# Patient Record
Sex: Female | Born: 1950 | Race: White | Hispanic: No | Marital: Married | Smoking: Never smoker
Health system: Southern US, Community
[De-identification: ages and names within clinical notes are randomized; demographics above are authoritative.]

## PROBLEM LIST (undated history)

## (undated) DIAGNOSIS — I639 Cerebral infarction, unspecified: Secondary | ICD-10-CM

## (undated) DIAGNOSIS — I499 Cardiac arrhythmia, unspecified: Secondary | ICD-10-CM

## (undated) HISTORY — DX: Cerebral infarction, unspecified: I63.9

## (undated) HISTORY — PX: ABDOMINAL HYSTERECTOMY: SHX81

## (undated) HISTORY — DX: Cardiac arrhythmia, unspecified: I49.9

---

## 2017-05-27 DIAGNOSIS — J209 Acute bronchitis, unspecified: Secondary | ICD-10-CM | POA: Diagnosis not present

## 2017-08-10 DIAGNOSIS — R69 Illness, unspecified: Secondary | ICD-10-CM | POA: Diagnosis not present

## 2017-08-11 DIAGNOSIS — R69 Illness, unspecified: Secondary | ICD-10-CM | POA: Diagnosis not present

## 2017-08-14 DIAGNOSIS — R69 Illness, unspecified: Secondary | ICD-10-CM | POA: Diagnosis not present

## 2017-11-14 DIAGNOSIS — H43812 Vitreous degeneration, left eye: Secondary | ICD-10-CM | POA: Diagnosis not present

## 2017-12-31 DIAGNOSIS — H43811 Vitreous degeneration, right eye: Secondary | ICD-10-CM | POA: Diagnosis not present

## 2018-02-11 DIAGNOSIS — R69 Illness, unspecified: Secondary | ICD-10-CM | POA: Diagnosis not present

## 2018-04-02 ENCOUNTER — Telehealth: Payer: Self-pay | Admitting: Family Medicine

## 2018-04-02 NOTE — Telephone Encounter (Signed)
Check records and se when last seen I ssee none in the computer record

## 2018-04-02 NOTE — Telephone Encounter (Signed)
Sorry we are overloaded with the patients who have been seeing [place] regularly and we cannot take on any more primary care patients for the foreseeable future

## 2018-04-02 NOTE — Telephone Encounter (Signed)
error 

## 2018-04-02 NOTE — Telephone Encounter (Signed)
Pt would like to reestablish with Dr. Richardson Landry and come in for a check up. She states she was seen 5 or 6 years ago. Her husband is Richard (Ed) Truluck DOB 09/12/45. Her CB# [phone].

## 2018-04-02 NOTE — Telephone Encounter (Addendum)
Patient not seen in Epic and also has no standing paper chart in office -patient stated she was seen here one time a long time ago

## 2018-04-03 ENCOUNTER — Emergency Department (HOSPITAL_COMMUNITY)
Admission: EM | Admit: 2018-04-03 | Discharge: 2018-04-03 | Disposition: A | Discharge status: Home / Self Care | Payer: Medicare HMO | Attending: Emergency Medicine | Admitting: Emergency Medicine

## 2018-04-03 ENCOUNTER — Other Ambulatory Visit: Payer: Self-pay

## 2018-04-03 ENCOUNTER — Emergency Department (HOSPITAL_COMMUNITY): Payer: Medicare HMO

## 2018-04-03 ENCOUNTER — Encounter (HOSPITAL_COMMUNITY): Payer: Self-pay

## 2018-04-03 DIAGNOSIS — I951 Orthostatic hypotension: Secondary | ICD-10-CM

## 2018-04-03 DIAGNOSIS — R1030 Lower abdominal pain, unspecified: Secondary | ICD-10-CM | POA: Diagnosis present

## 2018-04-03 DIAGNOSIS — K5792 Diverticulitis of intestine, part unspecified, without perforation or abscess without bleeding: Secondary | ICD-10-CM | POA: Diagnosis not present

## 2018-04-03 DIAGNOSIS — R109 Unspecified abdominal pain: Secondary | ICD-10-CM

## 2018-04-03 DIAGNOSIS — R55 Syncope and collapse: Secondary | ICD-10-CM

## 2018-04-03 DIAGNOSIS — R1084 Generalized abdominal pain: Secondary | ICD-10-CM | POA: Diagnosis not present

## 2018-04-03 DIAGNOSIS — K5730 Diverticulosis of large intestine without perforation or abscess without bleeding: Secondary | ICD-10-CM | POA: Diagnosis not present

## 2018-04-03 LAB — CBC WITH DIFFERENTIAL/PLATELET
Abs Immature Granulocytes: 0.02 10*3/uL (ref 0.00–0.07)
BASOS PCT: 1 %
Basophils Absolute: 0.1 10*3/uL (ref 0.0–0.1)
EOS ABS: 0.1 10*3/uL (ref 0.0–0.5)
EOS PCT: 1 %
HCT: 41.7 % (ref 36.0–46.0)
HEMOGLOBIN: 13.1 g/dL (ref 12.0–15.0)
Immature Granulocytes: 0 %
Lymphocytes Relative: 12 %
Lymphs Abs: 1.1 10*3/uL (ref 0.7–4.0)
MCH: 27.5 pg (ref 26.0–34.0)
MCHC: 31.4 g/dL (ref 30.0–36.0)
MCV: 87.4 fL (ref 80.0–100.0)
MONO ABS: 0.8 10*3/uL (ref 0.1–1.0)
Monocytes Relative: 8 %
Neutro Abs: 7.5 10*3/uL (ref 1.7–7.7)
Neutrophils Relative %: 78 %
PLATELETS: 266 10*3/uL (ref 150–400)
RBC: 4.77 MIL/uL (ref 3.87–5.11)
RDW: 13.1 % (ref 11.5–15.5)
WBC: 9.6 10*3/uL (ref 4.0–10.5)
nRBC: 0 % (ref 0.0–0.2)

## 2018-04-03 LAB — COMPREHENSIVE METABOLIC PANEL
ALK PHOS: 73 U/L (ref 38–126)
ALT: 25 U/L (ref 0–44)
ANION GAP: 9 (ref 5–15)
AST: 25 U/L (ref 15–41)
Albumin: 3.7 g/dL (ref 3.5–5.0)
BUN: 18 mg/dL (ref 8–23)
CALCIUM: 8.7 mg/dL — AB (ref 8.9–10.3)
CO2: 24 mmol/L (ref 22–32)
Chloride: 104 mmol/L (ref 98–111)
Creatinine, Ser: 0.69 mg/dL (ref 0.44–1.00)
GFR calc non Af Amer: >60 mL/min (ref >60)
Glucose, Bld: 154 mg/dL — ABNORMAL HIGH (ref 70–99)
Potassium: 3.8 mmol/L (ref 3.5–5.1)
Sodium: 137 mmol/L (ref 135–145)
Total Bilirubin: 0.7 mg/dL (ref 0.3–1.2)
Total Protein: 7.1 g/dL (ref 6.5–8.1)

## 2018-04-03 LAB — URINALYSIS, ROUTINE W REFLEX MICROSCOPIC
BILIRUBIN URINE: NEGATIVE
Glucose, UA: NEGATIVE mg/dL
Hgb urine dipstick: NEGATIVE
Ketones, ur: NEGATIVE mg/dL
Leukocytes, UA: NEGATIVE
NITRITE: NEGATIVE
Protein, ur: NEGATIVE mg/dL
SPECIFIC GRAVITY, URINE: 1.017 (ref 1.005–1.030)
pH: 7 (ref 5.0–8.0)

## 2018-04-03 LAB — LIPASE, BLOOD: Lipase: 25 U/L (ref 11–51)

## 2018-04-03 MED ORDER — SODIUM CHLORIDE 0.9 % IV BOLUS (SEPSIS)
1000.0000 mL | Freq: Once | INTRAVENOUS | Status: AC
  Administered 2018-04-03: 1000 mL via INTRAVENOUS

## 2018-04-03 MED ORDER — CIPROFLOXACIN HCL 500 MG PO TABS: 500.0000 mg | ORAL_TABLET | Freq: Two times a day (BID) | ORAL | 0 refills | Status: DC

## 2018-04-03 MED ORDER — METRONIDAZOLE 500 MG PO TABS: 500.0000 mg | ORAL_TABLET | Freq: Three times a day (TID) | ORAL | 0 refills | Status: DC

## 2018-04-03 MED ORDER — METRONIDAZOLE 500 MG PO TABS
500.0000 mg | ORAL_TABLET | Freq: Once | ORAL | Status: AC
  Administered 2018-04-03: 500 mg via ORAL
  Filled 2018-04-03: qty 1

## 2018-04-03 MED ORDER — CIPROFLOXACIN HCL 250 MG PO TABS
500.0000 mg | ORAL_TABLET | Freq: Once | ORAL | Status: AC
  Administered 2018-04-03: 500 mg via ORAL
  Filled 2018-04-03: qty 2

## 2018-04-03 MED ORDER — IOPAMIDOL (ISOVUE-300) INJECTION 61%
100.0000 mL | Freq: Once | INTRAVENOUS | Status: AC | PRN
  Administered 2018-04-03: 100 mL via INTRAVENOUS

## 2018-04-03 NOTE — ED Notes (Signed)
Patient transported to CT 

## 2018-04-03 NOTE — Discharge Instructions (Addendum)
Take the prescriptions as directed.  Take over the counter tylenol, as directed on packaging, as needed for discomfort. Eat a liquid diet for the next few days, then advance to a bland diet and then to your regular diet slowly as you can tolerate it. Keep yourself hydrated. Move slowly when changing positions. Call your regular medical doctor today to schedule a follow up appointment within the next 3 days. Call the GI doctor today to schedule a follow up appointment within the next week.  Return to the Emergency Department immediately if not improving (or even worsening) despite taking the medicines as prescribed, any black or bloody stool or vomit, if you develop a fever over "101," or for any other concerns.

## 2018-04-03 NOTE — ED Triage Notes (Signed)
Pt c/o abd pain intermittent for the past several days, got up to get a shower so she could go to urgent care when she "passed out in the bathroom"   Pt denies injury with fall and landed sitting on her buttocks.  Pt states it burns when she urinates and the pain is low in her pelvic area.

## 2018-04-03 NOTE — ED Provider Notes (Signed)
[facility] EMERGENCY DEPARTMENT Provider Note   CSN: [id number] Arrival date & time: 04/03/18  0543     History   Chief Complaint Chief Complaint  Patient presents with  . Abdominal Pain    HPI Paige Cole is a 67 y.o. female.  The history is provided by the patient.  Abdominal Pain   This is a new problem. The current episode started more than 2 days ago. The problem occurs daily. The problem has been gradually worsening. The pain is located in the generalized abdominal region. The pain is moderate. Associated symptoms include constipation and frequency. Pertinent negatives include fever, diarrhea, hematochezia, melena, nausea, vomiting, dysuria and headaches. Associated symptoms comments: Urinary pressure . The symptoms are aggravated by certain positions. Nothing relieves the symptoms.  She reports over the past several days she has been having abdominal pain.  She reports now she is having lower abdominal pressure.  This morning she stood up and went to the shower and had a brief syncopal episode.  Denies any trauma.  She reports she has had this before upon standing.  Husband found her, there were no seizures.  She was back to baseline very quickly. No cp/sob    PMH-none Past Surgical History:  Procedure Laterality Date  . ABDOMINAL HYSTERECTOMY       OB History   None      Home Medications    Prior to Admission medications   Not on File    Family History No family history on file.  Social History Social History   Tobacco Use  . Smoking status: Never Smoker  . Smokeless tobacco: Never Used  Substance Use Topics  . Alcohol use: Never    Frequency: Never  . Drug use: Never     Allergies   Penicillins   Review of Systems Review of Systems  Constitutional: Negative for fever.  Respiratory: Negative for shortness of breath.   Cardiovascular: Negative for chest pain.  Gastrointestinal: Positive for abdominal pain and constipation. Negative for  blood in stool, diarrhea, hematochezia, melena, nausea and vomiting.  Genitourinary: Positive for frequency and urgency. Negative for dysuria.  Musculoskeletal: Negative for back pain and neck pain.  Neurological: Positive for syncope. Negative for seizures and headaches.  All other systems reviewed and are negative.    Physical Exam Updated Vital Signs BP 114/64 (BP Location: Left Arm) Comment: Simultaneous filing. User may not have seen previous data.  Pulse 83 Comment: Simultaneous filing. User may not have seen previous data.  Temp 98.5 F (36.9 C) (Oral)   Resp 12 Comment: Simultaneous filing. User may not have seen previous data.  Ht 1.575 m (5\' 2" )   Wt 77.1 kg   SpO2 95% Comment: Simultaneous filing. User may not have seen previous data.  BMI 31.09 kg/m   Physical Exam CONSTITUTIONAL: Well developed/well nourished HEAD: Normocephalic/atraumatic EYES: EOMI/PERRL ENMT: Mucous membranes moist no signs of trauma NECK: supple no meningeal signs SPINE/BACK:entire spine nontender, no bruising/crepitance/stepoffs noted to spine CV: S1/S2 noted, no murmurs/rubs/gallops noted LUNGS: Lungs are clear to auscultation bilaterally, no apparent distress ABDOMEN: soft, mild suprapubic tenderness, no rebound or guarding, bowel sounds noted throughout abdomen GU:no cva tenderness NEURO: Pt is awake/alert/appropriate, moves all extremitiesx4.  No facial droop.  No arm or leg drift EXTREMITIES: pulses normal/equal, full ROM, no deformities noted SKIN: warm, color normal PSYCH: no abnormalities of mood noted, alert and oriented to situation   ED Treatments / Results  Labs (all labs ordered are listed, but only  abnormal results are displayed) Labs Reviewed  COMPREHENSIVE METABOLIC PANEL - Abnormal; Notable for the following components:      Result Value   Glucose, Bld 154 (*)    Calcium 8.7 (*)    All other components within normal limits  CBC WITH DIFFERENTIAL/PLATELET  LIPASE,  BLOOD  URINALYSIS, ROUTINE W REFLEX MICROSCOPIC    EKG EKG Interpretation  Date/Time:  Friday April 03 2018 05:56:04 EST Ventricular Rate:  77 PR Interval:    QRS Duration: 100 QT Interval:  371 QTC Calculation: 420 R Axis:   3 Text Interpretation:  Sinus rhythm Low voltage, precordial leads No previous ECGs available Confirmed by Ripley Fraise [phone]) on 04/03/2018 6:02:28 AM   Radiology No results found.  Procedures Procedures    Medications Ordered in ED Medications  sodium chloride 0.9 % bolus 1,000 mL (has no administration in time range)  sodium chloride 0.9 % bolus 1,000 mL (1,000 mLs Intravenous New Bag/Given 04/03/18 0923)     Initial Impression / Assessment and Plan / ED Course  I have reviewed the triage vital signs and the nursing notes.  Pertinent labs & imaging results that were available during my care of the patient were reviewed by me and considered in my medical decision making (see chart for details).     7:05 AM His main complaint is abdominal pain.  Labs and urinalysis pending.  Strong suspicion her syncopal event was a vagal response.  EKG is unremarkable.  She denies any chest pain or shortness of breath. 7:22 AM She reports continued abdominal pain.  She declines pain meds.  On repeat exam, she has focal left lower quadrant and suprapubic tenderness.  We will proceed with CT imaging Signed out to Dr. Thurnell Garbe with imaging/urinalysis pending  Final Clinical Impressions(s) / ED Diagnoses   Final diagnoses:  Syncope and collapse    ED Discharge Orders    None       Ripley Fraise, MD 04/03/18 [phone]

## 2018-04-03 NOTE — ED Provider Notes (Signed)
Pt received at sign out with CT pending. 67yo F, c/o abd pain x2 days, "got up too fast" this morning and had syncopal episode while in the shower; immediately back to baseline and has hx of same. See previous EDP note for further details of HPI and MDM.  Pt initially orthostatic on VS; received IVF and VS improved. Pt ambulatory with steady gait, resps easy, NAD. States she "feels much better now." Pt has tol PO well while in the ED without N/V.  No stooling while in the ED. CT with diverticulitis, WBC count normal and pt remains afebrile. Offered admission; pt would like to go home. Pt makes her own medical decisions and family is in agreement with her decision. Return precautions given. Dx and testing d/w pt and family.  Questions answered.  Verb understanding, agreeable to d/c home with outpt f/u.   Patient Vitals for the past 24 hrs:  BP Temp Temp src Pulse Resp SpO2 Height Weight  04/03/18 0830 132/81 - - 79 12 97 % - -  04/03/18 0600 114/64 98.5 F (36.9 C) Oral 83 12 95 % - -  04/03/18 0557 - - - - - - 5\' 2"  (1.575 m) 77.1 kg    09:06:16 Orthostatic Vital Signs RG  Orthostatic Lying   BP- Lying: 138/74   Pulse- Lying: 81       Orthostatic Sitting  BP- Sitting: 141/76   Pulse- Sitting: 81       Orthostatic Standing at 0 minutes  BP- Standing at 0 minutes: 135/78   Pulse- Standing at 0 minutes: 87     Results for orders placed or performed during the hospital encounter of 04/03/18  CBC with Differential/Platelet  Result Value Ref Range   WBC 9.6 4.0 - 10.5 K/uL   RBC 4.77 3.87 - 5.11 MIL/uL   Hemoglobin 13.1 12.0 - 15.0 g/dL   HCT 41.7 36.0 - 46.0 %   MCV 87.4 80.0 - 100.0 fL   MCH 27.5 26.0 - 34.0 pg   MCHC 31.4 30.0 - 36.0 g/dL   RDW 13.1 11.5 - 15.5 %   Platelets 266 150 - 400 K/uL   nRBC 0.0 0.0 - 0.2 %   Neutrophils Relative % 78 %   Neutro Abs 7.5 1.7 - 7.7 K/uL   Lymphocytes Relative 12 %   Lymphs Abs 1.1 0.7 - 4.0 K/uL   Monocytes Relative 8 %   Monocytes  Absolute 0.8 0.1 - 1.0 K/uL   Eosinophils Relative 1 %   Eosinophils Absolute 0.1 0.0 - 0.5 K/uL   Basophils Relative 1 %   Basophils Absolute 0.1 0.0 - 0.1 K/uL   Immature Granulocytes 0 %   Abs Immature Granulocytes 0.02 0.00 - 0.07 K/uL  Comprehensive metabolic panel  Result Value Ref Range   Sodium 137 135 - 145 mmol/L   Potassium 3.8 3.5 - 5.1 mmol/L   Chloride 104 98 - 111 mmol/L   CO2 24 22 - 32 mmol/L   Glucose, Bld 154 (H) 70 - 99 mg/dL   BUN 18 8 - 23 mg/dL   Creatinine, Ser 0.69 0.44 - 1.00 mg/dL   Calcium 8.7 (L) 8.9 - 10.3 mg/dL   Total Protein 7.1 6.5 - 8.1 g/dL   Albumin 3.7 3.5 - 5.0 g/dL   AST 25 15 - 41 U/L   ALT 25 0 - 44 U/L   Alkaline Phosphatase 73 38 - 126 U/L   Total Bilirubin 0.7 0.3 - 1.2 mg/dL  GFR calc non Af Amer >60 >60 mL/min   GFR calc Af Amer >60 >60 mL/min   Anion gap 9 5 - 15  Lipase, blood  Result Value Ref Range   Lipase 25 11 - 51 U/L  Urinalysis, Routine w reflex microscopic  Result Value Ref Range   Color, Urine STRAW (A) YELLOW   APPearance CLEAR CLEAR   Specific Gravity, Urine 1.017 1.005 - 1.030   pH 7.0 5.0 - 8.0   Glucose, UA NEGATIVE NEGATIVE mg/dL   Hgb urine dipstick NEGATIVE NEGATIVE   Bilirubin Urine NEGATIVE NEGATIVE   Ketones, ur NEGATIVE NEGATIVE mg/dL   Protein, ur NEGATIVE NEGATIVE mg/dL   Nitrite NEGATIVE NEGATIVE   Leukocytes, UA NEGATIVE NEGATIVE   Ct Abdomen Pelvis W Contrast Result Date: 04/03/2018 CLINICAL DATA:  Lower abdominal pain EXAM: CT ABDOMEN AND PELVIS WITH CONTRAST TECHNIQUE: Multidetector CT imaging of the abdomen and pelvis was performed using the standard protocol following bolus administration of intravenous contrast. CONTRAST:  141mL ISOVUE-300 IOPAMIDOL (ISOVUE-300) INJECTION 61% COMPARISON:  None. FINDINGS: Lower chest: Lung bases clear.  Normal heart. Hepatobiliary: 4.3 x 2.2 cm mass in the posterior right liver with peripheral enhancement that fills in on delayed imaging compatible with  hemangioma. Similar 14 mm lesion in the lateral segment of the left lobe of the liver just above the gallbladder fossa also consistent with hemangioma. Gallbladder bile ducts normal. Pancreas: Negative Spleen: Negative Adrenals/Urinary Tract: Adrenal glands are unremarkable. Kidneys are normal, without renal calculi, focal lesion, or hydronephrosis. Bladder is unremarkable. Stomach/Bowel: Normal stomach. Negative for bowel obstruction. Normal appendix Segmental edema in the sigmoid colon with stranding in the pericolonic fat. Associated diverticular changes in the sigmoid colon. Findings compatible with diverticulitis without abscess or perforation. Vascular/Lymphatic: Mild atherosclerotic disease. Negative for lymphadenopathy. Reproductive: Hysterectomy.  No pelvic mass lesion. Other: No free fluid. Musculoskeletal: No acute skeletal abnormality. Mild degenerative changes in the lumbar spine. IMPRESSION: Sigmoid diverticulitis.  Negative for abscess or perforation. 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 Normal appendix Electronically Signed   By: Franchot Gallo M.D.   On: 04/03/2018 08:23       Francine Graven, DO 04/03/18 [phone]

## 2018-04-03 NOTE — ED Notes (Signed)
Patient given discharge instruction, verbalized understand. IV removed, band aid applied. Patient ambulatory out of the department.  

## 2018-04-03 NOTE — ED Notes (Signed)
Pt returned from CT and assisted to BR

## 2018-04-06 NOTE — Telephone Encounter (Signed)
Contacted Ms. Waszak and made her aware.

## 2018-04-07 NOTE — ED Notes (Signed)
Pt called reporting the antibiotics are making her nauseated and she can't follow up with gi or pcp.  Notified Dr. Roderic Palau and called in prescription for zofran 4mg  ODT every 4 hours prn nausea dispense 15.  Prescription called in to [facility] [address].

## 2018-04-28 ENCOUNTER — Telehealth (INDEPENDENT_AMBULATORY_CARE_PROVIDER_SITE_OTHER): Payer: Self-pay | Admitting: *Deleted

## 2018-04-28 ENCOUNTER — Encounter (INDEPENDENT_AMBULATORY_CARE_PROVIDER_SITE_OTHER): Payer: Self-pay | Admitting: Internal Medicine

## 2018-04-28 ENCOUNTER — Ambulatory Visit (INDEPENDENT_AMBULATORY_CARE_PROVIDER_SITE_OTHER): Payer: Medicare HMO | Admitting: Internal Medicine

## 2018-04-28 ENCOUNTER — Encounter (INDEPENDENT_AMBULATORY_CARE_PROVIDER_SITE_OTHER): Payer: Self-pay | Admitting: *Deleted

## 2018-04-28 VITALS — BP 162/80 | HR 64 | Temp 98.2°F | Ht 62.0 in | Wt 168.4 lb

## 2018-04-28 DIAGNOSIS — K5732 Diverticulitis of large intestine without perforation or abscess without bleeding: Secondary | ICD-10-CM | POA: Diagnosis not present

## 2018-04-28 MED ORDER — CIPROFLOXACIN HCL 500 MG PO TABS: 500.0000 mg | ORAL_TABLET | Freq: Two times a day (BID) | ORAL | 0 refills | Status: DC

## 2018-04-28 MED ORDER — METRONIDAZOLE 250 MG PO TABS: 250.0000 mg | ORAL_TABLET | Freq: Three times a day (TID) | ORAL | 0 refills | Status: DC

## 2018-04-28 MED ORDER — PEG 3350-KCL-NA BICARB-NACL 420 G PO SOLR: 4000.0000 mL | Freq: Once | ORAL | 0 refills | Status: AC

## 2018-04-28 NOTE — Patient Instructions (Signed)
The risks of bleeding, perforation and infection were reviewed with patient.  

## 2018-04-28 NOTE — Progress Notes (Signed)
   Subjective:    Patient ID: Paige Cole, female    DOB: August 23, 1950, 67 y.o.   MRN: [id number] HPI  Has appt with [facility] on the 30th of th is month.  Referred by the ED for diverticulitis. Seen in the ED 04/03/2018 for lower abdominal pain x 2 days. Underwent a CT scan of the abdomen/pelvis with CM which revealed sigmoid diverticulitis. Negative for abscess or perforation. 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 Normal appendix.  She was discharge on Cipro and Flagyl x 7 days Cipro 500 mg BID x 7 days and Flagyl 500 mg three times a day x 7 days. She says today she is doing great.  She says she had a pinge in her left lower quadrant 1 week ago which lasted about 24 hrs and then resolved. Her appetite is okay. She says she has been on a liquid and a bland diet. She has been very careful what she eats. She says when she has a BM she does have some tenderness but not always with her BMs.  Back in April of this year, she had the same symptoms for 2-3 days and took Ibuprofen and it resolved. Her appetite is okay.  She has a BM at least one a day.  Sometimes it feels like she is slow to empty.  Has never undergone a colonoscopy.   Review of Systems No past medical history on file.  Past Surgical History:  Procedure Laterality Date  . ABDOMINAL HYSTERECTOMY        Current Outpatient Medications on File Prior to Visit  Medication Sig Dispense Refill  . ciprofloxacin (CIPRO) 500 MG tablet Take 1 tablet (500 mg total) by mouth 2 (two) times daily. (Patient not taking: Reported on 04/28/2018) 14 tablet 0  . metroNIDAZOLE (FLAGYL) 500 MG tablet Take 1 tablet (500 mg total) by mouth 3 (three) times daily. (Patient not taking: Reported on 04/28/2018) 21 tablet 0   No current facility-administered medications on file prior to visit.         Objective:   Physical Exam Blood pressure (!) 162/80, pulse 64, temperature 98.2 F (36.8 C), height 5\' 2"  (1.575 m), weight  168 lb 6.4 oz (76.4 kg). Alert and oriented. Skin warm and dry. Oral mucosa is moist.   . Sclera anicteric, conjunctivae is pink. Thyroid not enlarged. No cervical lymphadenopathy. Lungs clear. Heart regular rate and rhythm.  Abdomen is soft. Bowel sounds are positive. No hepatomegaly. No abdominal masses felt. No tenderness.  No edema to lower extremities.          Assessment & Plan:  Diverticulitis.Need to rule out colonic neoplasm. Colonoscopy. The risks of bleeding, perforation and infection were reviewed with patient.

## 2018-04-28 NOTE — Telephone Encounter (Signed)
Patient needs trilyte 

## 2018-04-29 NOTE — Progress Notes (Signed)
Rx has been filled 

## 2018-05-18 DIAGNOSIS — R030 Elevated blood-pressure reading, without diagnosis of hypertension: Secondary | ICD-10-CM | POA: Diagnosis not present

## 2018-06-08 ENCOUNTER — Other Ambulatory Visit: Payer: Self-pay

## 2018-06-08 ENCOUNTER — Encounter (HOSPITAL_COMMUNITY)
Admission: RE | Disposition: A | Discharge status: Home / Self Care | Payer: Self-pay | Source: Home / Self Care | Attending: Internal Medicine

## 2018-06-08 ENCOUNTER — Encounter (HOSPITAL_COMMUNITY): Payer: Self-pay | Admitting: *Deleted

## 2018-06-08 ENCOUNTER — Ambulatory Visit (HOSPITAL_COMMUNITY)
Admission: RE | Admit: 2018-06-08 | Discharge: 2018-06-08 | Disposition: A | Discharge status: Home / Self Care | Payer: Medicare HMO | Attending: Internal Medicine | Admitting: Internal Medicine

## 2018-06-08 DIAGNOSIS — D123 Benign neoplasm of transverse colon: Secondary | ICD-10-CM | POA: Diagnosis not present

## 2018-06-08 DIAGNOSIS — Z09 Encounter for follow-up examination after completed treatment for conditions other than malignant neoplasm: Secondary | ICD-10-CM | POA: Diagnosis not present

## 2018-06-08 DIAGNOSIS — K5732 Diverticulitis of large intestine without perforation or abscess without bleeding: Secondary | ICD-10-CM | POA: Diagnosis not present

## 2018-06-08 DIAGNOSIS — Z8719 Personal history of other diseases of the digestive system: Secondary | ICD-10-CM | POA: Diagnosis present

## 2018-06-08 DIAGNOSIS — D124 Benign neoplasm of descending colon: Secondary | ICD-10-CM | POA: Insufficient documentation

## 2018-06-08 DIAGNOSIS — K644 Residual hemorrhoidal skin tags: Secondary | ICD-10-CM | POA: Diagnosis not present

## 2018-06-08 DIAGNOSIS — K5730 Diverticulosis of large intestine without perforation or abscess without bleeding: Secondary | ICD-10-CM | POA: Diagnosis not present

## 2018-06-08 DIAGNOSIS — Z880 Allergy status to penicillin: Secondary | ICD-10-CM | POA: Diagnosis not present

## 2018-06-08 DIAGNOSIS — K6289 Other specified diseases of anus and rectum: Secondary | ICD-10-CM

## 2018-06-08 HISTORY — PX: COLONOSCOPY: SHX5424

## 2018-06-08 HISTORY — PX: POLYPECTOMY: SHX5525

## 2018-06-08 SURGERY — COLONOSCOPY
Anesthesia: Moderate Sedation

## 2018-06-08 MED ORDER — MIDAZOLAM HCL 5 MG/5ML IJ SOLN
INTRAMUSCULAR | Status: DC | PRN
  Administered 2018-06-08: 2 mg via INTRAVENOUS
  Administered 2018-06-08: 1 mg via INTRAVENOUS
  Administered 2018-06-08 (×2): 2 mg via INTRAVENOUS
  Administered 2018-06-08: 1 mg via INTRAVENOUS

## 2018-06-08 MED ORDER — MEPERIDINE HCL 50 MG/ML IJ SOLN
INTRAMUSCULAR | Status: DC | PRN
  Administered 2018-06-08 (×2): 25 mg via INTRAVENOUS

## 2018-06-08 MED ORDER — STERILE WATER FOR IRRIGATION IR SOLN
Status: DC | PRN
  Administered 2018-06-08: 08:00:00

## 2018-06-08 MED ORDER — MIDAZOLAM HCL 5 MG/5ML IJ SOLN
INTRAMUSCULAR | Status: AC
  Filled 2018-06-08: qty 10

## 2018-06-08 MED ORDER — MEPERIDINE HCL 50 MG/ML IJ SOLN
INTRAMUSCULAR | Status: AC
  Filled 2018-06-08: qty 1

## 2018-06-08 MED ORDER — SODIUM CHLORIDE 0.9 % IV SOLN
INTRAVENOUS | Status: DC
  Administered 2018-06-08: 1000 mL via INTRAVENOUS

## 2018-06-08 NOTE — H&P (Signed)
Paige Cole is an 68 y.o. female.   Chief Complaint: Patient is here for colonoscopy. HPI: Patient 68 year old Caucasian female who is here for diagnostic colonoscopy.  In mid November 2019 she was diagnosed with sigmoid diverticulitis and treated with antibiotics.  She has not had any pain for about 4 weeks.  She denies any now rectal bleeding.  She is prone to diarrhea but no constipation.  She does not take any antidiarrheals.  She takes Imodium once a month for headache center. Family history is negative for CRC.  Past medical history: History of sigmoid diverticulitis November 2019.  Past Surgical History:  Procedure Laterality Date  .  Vaginal HYSTERECTOMY      History reviewed. No pertinent family history. Social History:  reports that she has never smoked. She has never used smokeless tobacco. She reports that she does not drink alcohol or use drugs.  Allergies:  Allergies  Allergen Reactions  . Penicillins Rash    Has patient had a PCN reaction causing immediate rash, facial/tongue/throat swelling, SOB or lightheadedness with hypotension: No Has patient had a PCN reaction causing severe rash involving mucus membranes or skin necrosis: No Has patient had a PCN reaction that required hospitalization: No Has patient had a PCN reaction occurring within the last 10 years: No If all of the above answers are "NO", then may proceed with Cephalosporin use.     Medications Prior to Admission  Medication Sig Dispense Refill  . ibuprofen (ADVIL,MOTRIN) 200 MG tablet Take 200 mg by mouth every 8 (eight) hours as needed (for pain.).    Marland Kitchen acetaminophen (TYLENOL) 500 MG tablet Take 500 mg by mouth every 6 (six) hours as needed (for pain.).    Marland Kitchen ciprofloxacin (CIPRO) 500 MG tablet Take 1 tablet (500 mg total) by mouth 2 (two) times daily. (Patient not taking: Reported on 06/03/2018) 14 tablet 0  . metroNIDAZOLE (FLAGYL) 250 MG tablet Take 1 tablet (250 mg total) by mouth 3 (three) times  daily. (Patient not taking: Reported on 06/03/2018) 21 tablet 0  . Probiotic Product (PROBIOTIC PO) Take 1 capsule by mouth daily.      No results found for this or any previous visit (from the past 48 hour(s)). No results found.  ROS  Blood pressure (!) 141/70, pulse 80, temperature 97.8 F (36.6 C), temperature source Oral, resp. rate 15, height 5\' 2"  (1.575 m), weight 73.9 kg, SpO2 100 %. Physical Exam  Constitutional: She appears well-developed and well-nourished.  HENT:  Mouth/Throat: Oropharynx is clear and moist.  Eyes: Conjunctivae are normal. No scleral icterus.  Neck: No thyromegaly present.  Cardiovascular: Normal rate, regular rhythm and normal heart sounds.  No murmur heard. Respiratory: Effort normal and breath sounds normal.  GI: She exhibits no distension and no mass.  Musculoskeletal:        General: No edema.  Lymphadenopathy:    She has no cervical adenopathy.  Neurological: She is alert.  Skin: Skin is warm and dry.     Assessment/Plan History of sigmoid diverticulitis. Diagnostic colonoscopy.  Hildred Laser, MD 06/08/2018, 7:31 AM

## 2018-06-08 NOTE — Op Note (Signed)
[facility] Patient Name: Paige Cole Procedure Date: 06/08/2018 7:22 AM MRN: [id number] Date of Birth: 11-17-1950 Attending MD: Hildred Laser , MD CSN: [id number] Age: 68 Admit Type: Outpatient Procedure:                Colonoscopy Indications:              Follow-up of diverticulitis Providers:                Hildred Laser, MD, Hinton Rao, RN, Gerome Sam, RN, Randa Spike, Technician Referring MD:             [facility]. Medicines:                Meperidine 50 mg IV, Midazolam 8 mg IV Complications:            No immediate complications. Estimated Blood Loss:     Estimated blood loss was minimal. Procedure:                Pre-Anesthesia Assessment:                           - Prior to the procedure, a History and Physical                            was performed, and patient medications and                            allergies were reviewed. The patient's tolerance of                            previous anesthesia was also reviewed. The risks                            and benefits of the procedure and the sedation                            options and risks were discussed with the patient.                            All questions were answered, and informed consent                            was obtained. Prior Anticoagulants: The patient                            last took ibuprofen 7 days prior to the procedure.                            ASA Grade Assessment: I - A normal, healthy                            patient. After reviewing the risks and benefits,  the patient was deemed in satisfactory condition to                            undergo the procedure.                           After obtaining informed consent, the colonoscope                            was passed under direct vision. Throughout the                            procedure, the patient's blood pressure, pulse, and               oxygen saturations were monitored continuously. The                            [id number]-[id number] ([id number]) scope was introduced through                            the anus and advanced to the the cecum, identified                            by appendiceal orifice and ileocecal valve. The                            colonoscopy was performed without difficulty. The                            patient tolerated the procedure well. The quality                            of the bowel preparation was excellent. The                            ileocecal valve, appendiceal orifice, and rectum                            were photographed. Scope In: 7:44:36 AM Scope Out: 8:11:31 AM Scope Withdrawal Time: 0 hours 15 minutes 45 seconds  Total Procedure Duration: 0 hours 26 minutes 55 seconds  Findings:      The perianal and digital rectal examinations were normal.      Scattered small and medium mouthed diverticula were found in the sigmoid       colon and hepatic flexure.      Two sessile polyps were found in the descending colon and splenic       flexure. The polyps were 5 mm in size. These polyps were removed with a       cold snare. Resection and retrieval were complete. The pathology       specimen was placed into Bottle Number 1.      External hemorrhoids were found during retroflexion. The hemorrhoids       were small.      Anal papilla(e) were hypertrophied. Impression:               -  Diverticulosis in the sigmoid colon and at the                            hepatic flexure.                           - Two 5 mm polyps in the descending colon and at                            the splenic flexure, removed with a cold snare.                            Resected and retrieved.                           - External hemorrhoids.                           - Anal papilla(e) were hypertrophied. Moderate Sedation:      Moderate (conscious) sedation was administered by the endoscopy nurse        and supervised by the endoscopist. The following parameters were       monitored: oxygen saturation, heart rate, blood pressure, CO2       capnography and response to care. Total physician intraservice time was       34 minutes. Recommendation:           - Patient has a contact number available for                            emergencies. The signs and symptoms of potential                            delayed complications were discussed with the                            patient. Return to normal activities tomorrow.                            Written discharge instructions were provided to the                            patient.                           - High fiber diet today.                           - Continue present medications.                           - No aspirin, ibuprofen, naproxen, or other                            non-steroidal anti-inflammatory drugs for 1 day.                           -  Await pathology results.                           - Repeat colonoscopy is recommended. The                            colonoscopy date will be determined after pathology                            results from today's exam become available for                            review. Procedure Code(s):        --- Professional ---                           [phone], Colonoscopy, flexible; with removal of                            tumor(s), polyp(s), or other lesion(s) by snare                            technique                           99153, Moderate sedation; each additional 15                            minutes intraservice time                           G0500, Moderate sedation services provided by the                            same physician or other qualified health care                            professional performing a gastrointestinal                            endoscopic service that sedation supports,                            requiring the presence of an independent trained                             observer to assist in the monitoring of the                            patient's level of consciousness and physiological                            status; initial 15 minutes of intra-service time;                            patient age 38 years or older (additional  time may                            be reported with [phone], as appropriate) Diagnosis Code(s):        --- Professional ---                           K64.4, Residual hemorrhoidal skin tags                           D12.4, Benign neoplasm of descending colon                           D12.3, Benign neoplasm of transverse colon (hepatic                            flexure or splenic flexure)                           K62.89, Other specified diseases of anus and rectum                           K57.32, Diverticulitis of large intestine without                            perforation or abscess without bleeding                           K57.30, Diverticulosis of large intestine without                            perforation or abscess without bleeding CPT copyright 2018 American [employer] Association. All rights reserved. The codes documented in this report are preliminary and upon coder review may  be revised to meet current compliance requirements. Hildred Laser, MD Hildred Laser, MD 06/08/2018 8:24:48 AM This report has been signed electronically. Number of Addenda: 0

## 2018-06-08 NOTE — Discharge Instructions (Signed)
No aspirin or NSAIDs for 24 hours. Resume other medications as before. High-fiber diet. No driving for 24 hours. Physician will call with biopsy results.  PATIENT INSTRUCTIONS POST-ANESTHESIA  IMMEDIATELY FOLLOWING SURGERY:  Do not drive or operate machinery for the first twenty four hours after surgery.  Do not make any important decisions for twenty four hours after surgery or while taking narcotic pain medications or sedatives.  If you develop intractable nausea and vomiting or a severe headache please notify your doctor immediately.  FOLLOW-UP:  Please make an appointment with your surgeon as instructed. You do not need to follow up with anesthesia unless specifically instructed to do so.  WOUND CARE INSTRUCTIONS (if applicable):  Keep a dry clean dressing on the anesthesia/puncture wound site if there is drainage.  Once the wound has quit draining you may leave it open to air.  Generally you should leave the bandage intact for twenty four hours unless there is drainage.  If the epidural site drains for more than 36-48 hours please call the anesthesia department.  QUESTIONS?:  Please feel free to call your physician or the hospital operator if you have any questions, and they will be happy to assist you.      Colonoscopy, Adult, Care After This sheet gives you information about how to care for yourself after your procedure. Your doctor may also give you more specific instructions. If you have problems or questions, call your doctor. What can I expect after the procedure? After the procedure, it is common to have:  A small amount of blood in your poop for 24 hours.  Some gas.  Mild cramping or bloating in your belly. Follow these instructions at home: General instructions  For the first 24 hours after the procedure: ? Do not drive or use machinery. ? Do not sign important documents. ? Do not drink alcohol. ? Do your daily activities more slowly than normal. ? Eat foods that are  soft and easy to digest.  Take over-the-counter or prescription medicines only as told by your doctor. To help cramping and bloating:   Try walking around.  Put heat on your belly (abdomen) as told by your doctor. Use a heat source that your doctor recommends, such as a moist heat pack or a heating pad. ? Put a towel between your skin and the heat source. ? Leave the heat on for 20-30 minutes. ? Remove the heat if your skin turns bright red. This is especially important if you cannot feel pain, heat, or cold. You can get burned. Eating and drinking   Drink enough fluid to keep your pee (urine) clear or pale yellow.  Return to your normal diet as told by your doctor. Avoid heavy or fried foods that are hard to digest.  Avoid drinking alcohol for as long as told by your doctor. Contact a doctor if:  You have blood in your poop (stool) 2-3 days after the procedure. Get help right away if:  You have more than a small amount of blood in your poop.  You see large clumps of tissue (blood clots) in your poop.  Your belly is swollen.  You feel sick to your stomach (nauseous).  You throw up (vomit).  You have a fever.  You have belly pain that gets worse, and medicine does not help your pain. Summary  After the procedure, it is common to have a small amount of blood in your poop. You may also have mild cramping and bloating in your  belly.  For the first 24 hours after the procedure, do not drive or use machinery, do not sign important documents, and do not drink alcohol.  Get help right away if you have a lot of blood in your poop, feel sick to your stomach, have a fever, or have more belly pain. This information is not intended to replace advice given to you by your health care provider. Make sure you discuss any questions you have with your health care provider. Document Released: 06/08/2010 Document Revised: 03/06/2017 Document Reviewed: 01/29/2016 Elsevier Interactive Patient  Education  2019 [employer] [place].   Colon Polyps  Polyps are tissue growths inside the body. Polyps can grow in many places, including the large intestine (colon). A polyp may be a round bump or a mushroom-shaped growth. You could have one polyp or several. Most colon polyps are noncancerous (benign). However, some colon polyps can become cancerous over time. Finding and removing the polyps early can help prevent this. What are the causes? The exact cause of colon polyps is not known. What increases the risk? You are more likely to develop this condition if you:  Have a family history of colon cancer or colon polyps.  Are older than 81 or older than 45 if you are African American.  Have inflammatory bowel disease, such as ulcerative colitis or Crohn's disease.  Have certain hereditary conditions, such as: ? Familial adenomatous polyposis. ? Lynch syndrome. ? Turcot syndrome. ? Peutz-Jeghers syndrome.  Are overweight.  Smoke cigarettes.  Do not get enough exercise.  Drink too much alcohol.  Eat a diet that is high in fat and red meat and low in fiber.  Had childhood cancer that was treated with abdominal radiation. What are the signs or symptoms? Most polyps do not cause symptoms. If you have symptoms, they may include:  Blood coming from your rectum when having a bowel movement.  Blood in your stool. The stool may look dark red or black.  Abdominal pain.  A change in bowel habits, such as constipation or diarrhea. How is this diagnosed? This condition is diagnosed with a colonoscopy. This is a procedure in which a lighted, flexible scope is inserted into the anus and then passed into the colon to examine the area. Polyps are sometimes found when a colonoscopy is done as part of routine cancer screening tests. How is this treated? Treatment for this condition involves removing any polyps that are found. Most polyps can be removed during a colonoscopy. Those polyps will  then be tested for cancer. Additional treatment may be needed depending on the results of testing. Follow these instructions at home: Lifestyle  Maintain a healthy weight, or lose weight if recommended by your health care provider.  Exercise every day or as told by your health care provider.  Do not use any products that contain nicotine or tobacco, such as cigarettes and e-cigarettes. If you need help quitting, ask your health care provider.  If you drink alcohol, limit how much you have: ? 0-1 drink a day for women. ? 0-2 drinks a day for men.  Be aware of how much alcohol is in your drink. In the [place] one drink equals one 12 oz bottle of beer (355 mL), one 5 oz glass of wine (148 mL), or one 1 oz shot of hard liquor (44 mL). Eating and drinking   Eat foods that are high in fiber, such as fruits, vegetables, and whole grains.  Eat foods that are high in calcium and  vitamin D, such as milk, cheese, yogurt, eggs, liver, fish, and broccoli.  Limit foods that are high in fat, such as fried foods and desserts.  Limit the amount of red meat and processed meat you eat, such as hot dogs, sausage, bacon, and lunch meats. General instructions  Keep all follow-up visits as told by your health care provider. This is important. ? This includes having regularly scheduled colonoscopies. ? Talk to your health care provider about when you need a colonoscopy. Contact a health care provider if:  You have new or worsening bleeding during a bowel movement.  You have new or increased blood in your stool.  You have a change in bowel habits.  You lose weight for no known reason. Summary  Polyps are tissue growths inside the body. Polyps can grow in many places, including the colon.  Most colon polyps are noncancerous (benign), but some can become cancerous over time.  This condition is diagnosed with a colonoscopy.  Treatment for this condition involves removing any polyps that are found.  Most polyps can be removed during a colonoscopy. This information is not intended to replace advice given to you by your health care provider. Make sure you discuss any questions you have with your health care provider. Document Released: 01/31/2004 Document Revised: 08/21/2017 Document Reviewed: 08/21/2017 Elsevier Interactive Patient Education  2019 [employer].    Diverticulosis  Diverticulosis is a condition that develops when small pouches (diverticula) form in the wall of the large intestine (colon). The colon is where water is absorbed and stool is formed. The pouches form when the inside layer of the colon pushes through weak spots in the outer layers of the colon. You may have a few pouches or many of them. What are the causes? The cause of this condition is not known. What increases the risk? The following factors may make you more likely to develop this condition:  Being older than age 22. Your risk for this condition increases with age. Diverticulosis is rare among people younger than age 55. By age 75, many people have it.  Eating a low-fiber diet.  Having frequent constipation.  Being overweight.  Not getting enough exercise.  Smoking.  Taking over-the-counter pain medicines, like aspirin and ibuprofen.  Having a family history of diverticulosis. What are the signs or symptoms? In most people, there are no symptoms of this condition. If you do have symptoms, they may include:  Bloating.  Cramps in the abdomen.  Constipation or diarrhea.  Pain in the lower left side of the abdomen. How is this diagnosed? This condition is most often diagnosed during an exam for other colon problems. Because diverticulosis usually has no symptoms, it often cannot be diagnosed independently. This condition may be diagnosed by:  Using a flexible scope to examine the colon (colonoscopy).  Taking an X-ray of the colon after dye has been put into the colon (barium  enema).  Doing a CT scan. How is this treated? You may not need treatment for this condition if you have never developed an infection related to diverticulosis. If you have had an infection before, treatment may include:  Eating a high-fiber diet. This may include eating more fruits, vegetables, and grains.  Taking a fiber supplement.  Taking a live bacteria supplement (probiotic).  Taking medicine to relax your colon.  Taking antibiotic medicines. Follow these instructions at home:  Drink 6-8 glasses of water or more each day to prevent constipation.  Try not to strain when you  have a bowel movement.  If you have had an infection before: ? Eat more fiber as directed by your health care provider or your diet and nutrition specialist (dietitian). ? Take a fiber supplement or probiotic, if your health care provider approves.  Take over-the-counter and prescription medicines only as told by your health care provider.  If you were prescribed an antibiotic, take it as told by your health care provider. Do not stop taking the antibiotic even if you start to feel better.  Keep all follow-up visits as told by your health care provider. This is important. Contact a health care provider if:  You have pain in your abdomen.  You have bloating.  You have cramps.  You have not had a bowel movement in 3 days. Get help right away if:  Your pain gets worse.  Your bloating becomes very bad.  You have a fever or chills, and your symptoms suddenly get worse.  You vomit.  You have bowel movements that are bloody or black.  You have bleeding from your rectum. Summary  Diverticulosis is a condition that develops when small pouches (diverticula) form in the wall of the large intestine (colon).  You may have a few pouches or many of them.  This condition is most often diagnosed during an exam for other colon problems.  If you have had an infection related to diverticulosis,  treatment may include increasing the fiber in your diet, taking supplements, or taking medicines. This information is not intended to replace advice given to you by your health care provider. Make sure you discuss any questions you have with your health care provider. Document Released: 02/01/2004 Document Revised: 03/25/2016 Document Reviewed: 03/25/2016 Elsevier Interactive Patient Education  2019 [employer]    Hemorrhoids Hemorrhoids are swollen veins that may develop:  In the butt (rectum). These are called internal hemorrhoids.  Around the opening of the butt (anus). These are called external hemorrhoids. Hemorrhoids can cause pain, itching, or bleeding. Most of the time, they do not cause serious problems. They usually get better with diet changes, lifestyle changes, and other home treatments. What are the causes? This condition may be caused by:  Having trouble pooping (constipation).  Pushing hard (straining) to poop.  Watery poop (diarrhea).  Pregnancy.  Being very overweight (obese).  Sitting for long periods of time.  Heavy lifting or other activity that causes you to strain.  Anal sex.  Riding a bike for a long period of time. What are the signs or symptoms? Symptoms of this condition include:  Pain.  Itching or soreness in the butt.  Bleeding from the butt.  Leaking poop.  Swelling in the area.  One or more lumps around the opening of your butt. How is this diagnosed? A doctor can often diagnose this condition by looking at the affected area. The doctor may also:  Do an exam that involves feeling the area with a gloved hand (digital rectal exam).  Examine the area inside your butt using a small tube (anoscope).  Order blood tests. This may be done if you have lost a lot of blood.  Have you get a test that involves looking inside the colon using a flexible tube with a camera on the end (sigmoidoscopy or colonoscopy). How is this treated? This  condition can usually be treated at home. Your doctor may tell you to change what you eat, make lifestyle changes, or try home treatments. If these do not help, procedures can be done to  remove the hemorrhoids or make them smaller. These may involve:  Placing rubber bands at the base of the hemorrhoids to cut off their blood supply.  Injecting medicine into the hemorrhoids to shrink them.  Shining a type of light energy onto the hemorrhoids to cause them to fall off.  Doing surgery to remove the hemorrhoids or cut off their blood supply. Follow these instructions at home: Eating and drinking   Eat foods that have a lot of fiber in them. These include whole grains, beans, nuts, fruits, and vegetables.  Ask your doctor about taking products that have added fiber (fibersupplements).  Reduce the amount of fat in your diet. You can do this by: ? Eating low-fat dairy products. ? Eating less red meat. ? Avoiding processed foods.  Drink enough fluid to keep your pee (urine) pale yellow. Managing pain and swelling   Take a warm-water bath (sitz bath) for 20 minutes to ease pain. Do this 3-4 times a day. You may do this in a bathtub or using a portable sitz bath that fits over the toilet.  If told, put ice on the painful area. It may be helpful to use ice between your warm baths. ? Put ice in a plastic bag. ? Place a towel between your skin and the bag. ? Leave the ice on for 20 minutes, 2-3 times a day. General instructions  Take over-the-counter and prescription medicines only as told by your doctor. ? Medicated creams and medicines may be used as told.  Exercise often. Ask your doctor how much and what kind of exercise is best for you.  Go to the bathroom when you have the urge to poop. Do not wait.  Avoid pushing too hard when you poop.  Keep your butt dry and clean. Use wet toilet paper or moist towelettes after pooping.  Do not sit on the toilet for a long time.  Keep all  follow-up visits as told by your doctor. This is important. Contact a doctor if you:  Have pain and swelling that do not get better with treatment or medicine.  Have trouble pooping.  Cannot poop.  Have pain or swelling outside the area of the hemorrhoids. Get help right away if you have:  Bleeding that will not stop. Summary  Hemorrhoids are swollen veins in the butt or around the opening of the butt.  They can cause pain, itching, or bleeding.  Eat foods that have a lot of fiber in them. These include whole grains, beans, nuts, fruits, and vegetables.  Take a warm-water bath (sitz bath) for 20 minutes to ease pain. Do this 3-4 times a day. This information is not intended to replace advice given to you by your health care provider. Make sure you discuss any questions you have with your health care provider. Document Released: 02/13/2008 Document Revised: 09/25/2017 Document Reviewed: 09/25/2017 Elsevier Interactive Patient Education  2019 [employer].

## 2018-06-10 ENCOUNTER — Encounter (HOSPITAL_COMMUNITY): Payer: Self-pay | Admitting: Internal Medicine

## 2018-06-29 DIAGNOSIS — R030 Elevated blood-pressure reading, without diagnosis of hypertension: Secondary | ICD-10-CM | POA: Diagnosis not present

## 2018-06-29 DIAGNOSIS — R05 Cough: Secondary | ICD-10-CM | POA: Diagnosis not present

## 2018-06-29 DIAGNOSIS — Z23 Encounter for immunization: Secondary | ICD-10-CM | POA: Diagnosis not present

## 2018-07-02 DIAGNOSIS — Z01419 Encounter for gynecological examination (general) (routine) without abnormal findings: Secondary | ICD-10-CM | POA: Diagnosis not present

## 2018-07-02 DIAGNOSIS — Z1212 Encounter for screening for malignant neoplasm of rectum: Secondary | ICD-10-CM | POA: Diagnosis not present

## 2018-07-02 DIAGNOSIS — Z1231 Encounter for screening mammogram for malignant neoplasm of breast: Secondary | ICD-10-CM | POA: Diagnosis not present

## 2018-09-02 ENCOUNTER — Ambulatory Visit (INDEPENDENT_AMBULATORY_CARE_PROVIDER_SITE_OTHER): Payer: Medicare HMO | Admitting: Internal Medicine

## 2018-09-02 ENCOUNTER — Encounter (INDEPENDENT_AMBULATORY_CARE_PROVIDER_SITE_OTHER): Payer: Self-pay | Admitting: Internal Medicine

## 2018-09-02 ENCOUNTER — Other Ambulatory Visit: Payer: Self-pay

## 2018-09-02 DIAGNOSIS — K5732 Diverticulitis of large intestine without perforation or abscess without bleeding: Secondary | ICD-10-CM

## 2018-09-02 MED ORDER — METRONIDAZOLE 500 MG PO TABS: 500.0000 mg | ORAL_TABLET | Freq: Two times a day (BID) | ORAL | 1 refills | Status: DC

## 2018-09-02 MED ORDER — CIPROFLOXACIN HCL 500 MG PO TABS: 500.0000 mg | ORAL_TABLET | Freq: Two times a day (BID) | ORAL | 1 refills | Status: DC

## 2018-09-02 NOTE — Progress Notes (Signed)
   Subjective:    Patient ID: Paige Cole, female    DOB: April 26, 1951, 68 y.o.   MRN: [id number] Start time 2:15pm End 2:30pm. Total time 15 minutes.  HPI This is a telephone OV. Patient is agreeable to talk with me. She is at home. I am in the office. Unable to do video OV. This is a telephone OV due to the COVID-19.  She c/o pain LLQ and lower abdomen today. She thinks she has another occurrence of diverticulitis.  Pain started about 4 days ago. Had some diarrhea this morning. BMs seem a little smaller.   She denies any nausea, vomiting or fever. No urinary symptoms.  She underwent a colonoscopy in January of this year which revealed: diverticulosis in the sigmoid colon and at the hepatic flexure. Two 5 mm polyps in the descending colon and at the splenic flexure. External hemorrhoids.  Anal papilla. Both polyps were tubular adenomas.  04/03/2018 CT abdomen/pelvis with CM: Sigmoid diverticulitis. Seen in the ED for same and was given Cipro and Flagyl .      Review of Systems  History reviewed. No pertinent past medical history.  Past Surgical History:  Procedure Laterality Date  . ABDOMINAL HYSTERECTOMY    . COLONOSCOPY N/A 06/08/2018   Procedure: COLONOSCOPY;  Surgeon: Rogene Houston, MD;  [facility];  Service: Endoscopy;  Laterality: N/A;  7:30  . POLYPECTOMY  06/08/2018   Procedure: POLYPECTOMY;  Surgeon: Rogene Houston, MD;  [facility];  Service: Endoscopy;;  cold snare [place] x 1, DC x1    Allergies  Allergen Reactions  . Penicillins Rash    Has patient had a PCN reaction causing immediate rash, facial/tongue/throat swelling, SOB or lightheadedness with hypotension: No Has patient had a PCN reaction causing severe rash involving mucus membranes or skin necrosis: No Has patient had a PCN reaction that required hospitalization: No Has patient had a PCN reaction occurring within the last 10 years: No If all of the above answers are "NO", then may proceed with  Cephalosporin use.     Current Outpatient Medications on File Prior to Visit  Medication Sig Dispense Refill  . acetaminophen (TYLENOL) 500 MG tablet Take 500 mg by mouth every 6 (six) hours as needed (for pain.).    Marland Kitchen ibuprofen (ADVIL,MOTRIN) 200 MG tablet Take 200 mg by mouth every 8 (eight) hours as needed (for pain.).    Marland Kitchen Probiotic Product (PROBIOTIC PO) Take 1 capsule by mouth daily.     No current facility-administered medications on file prior to visit.         Objective:   Physical Exam Deferred.       Assessment & Plan:  LLQ pain, diverticulitis. Will call in Cipro and Flagyl x 14 days. She will stay on a clear liquid diet for 3-4 days and then progress to a bland diet. She was advised if pain worsens, to go to the ED.

## 2018-10-06 ENCOUNTER — Encounter (INDEPENDENT_AMBULATORY_CARE_PROVIDER_SITE_OTHER): Payer: Self-pay

## 2018-10-07 ENCOUNTER — Ambulatory Visit (INDEPENDENT_AMBULATORY_CARE_PROVIDER_SITE_OTHER): Payer: Medicare HMO | Admitting: Internal Medicine

## 2019-03-04 ENCOUNTER — Ambulatory Visit (INDEPENDENT_AMBULATORY_CARE_PROVIDER_SITE_OTHER): Payer: Medicare HMO | Admitting: Nurse Practitioner

## 2019-03-07 NOTE — Progress Notes (Signed)
Subjective:    Patient ID: Paige Cole, female    DOB: [birth year], 68 y.o.   MRN: [id number]  HPI Paige Cole is a 68 year old female with a past medical history of colon polyps and diverticulitis.  Her first episode of diverticulitis was November 2019 which was confirmed by an abdominal/pelvic CT at [facility].  Diverticulitis pain resolved after taking antibiotics. She underwent a tele visit with Paige Castle NP on 08/2018 with complaints of LLQ pain which was consistent with her past episodes of diverticulitis. She was prescribed Cipro and Flagyl 500mg  po bid x 14 days. She continued  to have a low level of residual LLQ abdominal pain so she was represcribed Cipro and Flagyl 500 mg p.o. twice daily for additional 14 days and her pain resolved.  She denies having any current abdominal pain. If she lift something heavy she may develop left lower quadrant tenderness.  She is passing a normal formed bowel movement once daily.  No rectal bleeding or melena.  She takes ibuprofen 200 mg 1 tab once or twice monthly as needed for knee or foot pain.  Colonoscopy 06/08/2018: - Diverticulosis in the sigmoid colon and at the hepatic flexure. - Two 5 mm tubular adenomatous polyps in the descending colon and at the splenic flexure, removed with a cold snare. Resected and retrieved. - External hemorrhoids. - Anal papilla(e) were hypertrophied. -Recall colonoscopy in 5 years.  Abdominal/pelvic CT 04/03/2018: Sigmoid diverticulitis.  Negative for abscess or perforation. 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 Normal appendix  Current Outpatient Medications on File Prior to Visit  Medication Sig Dispense Refill  . acetaminophen (TYLENOL) 500 MG tablet Take 500 mg by mouth every 6 (six) hours as needed (for pain.).    Marland Kitchen ibuprofen (ADVIL,MOTRIN) 200 MG tablet Take 200 mg by mouth every 8 (eight) hours as needed (for pain.).    Marland Kitchen Probiotic Product (PROBIOTIC PO) Take 1 capsule by  mouth daily.     No current facility-administered medications on file prior to visit.    Allergies  Allergen Reactions  . Penicillins Rash    Has patient had a PCN reaction causing immediate rash, facial/tongue/throat swelling, SOB or lightheadedness with hypotension: No Has patient had a PCN reaction causing severe rash involving mucus membranes or skin necrosis: No Has patient had a PCN reaction that required hospitalization: No Has patient had a PCN reaction occurring within the last 10 years: No If all of the above answers are "NO", then may proceed with Cephalosporin use.     Review of Systems see HPI, all other systems reviewed and are negative     Objective:   Physical Exam  BP (!) 151/85   Pulse 97   Temp 98.4 F (36.9 C)   Ht 5\' 2"  (1.575 m)   Wt 174 lb 1.6 oz (79 kg)   BMI 31.84 kg/m  General: 68 year old female well-developed in no acute distress Eyes: Sclera nonicteric, conjunctiva pink Neck: Supple Heart: Regular rate and rhythm, no murmurs Lungs: Breath sounds clear throughout Abdomen: Soft, nontender, no masses or organomegaly, positive bowel sounds to all 4 quadrants Extremities: No edema Neuro: Alert and oriented x4, no focal deficits    Assessment & Plan:   33.  68 year old female with a history of sigmoid diverticulitis 04/08/2018 and 09/07/2018 -Avoid constipation -Drink 8 glasses of water daily, fiber diet as tolerated -Call our office if her lower abdominal pain recurs -Avoid lifting heavy objects -Follow up in the office as needed  2.  History of colon polyps -Next colonoscopy is due January 2025

## 2019-03-09 ENCOUNTER — Other Ambulatory Visit: Payer: Self-pay

## 2019-03-09 ENCOUNTER — Encounter (INDEPENDENT_AMBULATORY_CARE_PROVIDER_SITE_OTHER): Payer: Self-pay | Admitting: Nurse Practitioner

## 2019-03-09 ENCOUNTER — Ambulatory Visit (INDEPENDENT_AMBULATORY_CARE_PROVIDER_SITE_OTHER): Payer: Medicare HMO | Admitting: Nurse Practitioner

## 2019-03-09 DIAGNOSIS — K5790 Diverticulosis of intestine, part unspecified, without perforation or abscess without bleeding: Secondary | ICD-10-CM | POA: Insufficient documentation

## 2019-03-09 DIAGNOSIS — Z86010 Personal history of colonic polyps: Secondary | ICD-10-CM | POA: Diagnosis not present

## 2019-03-09 NOTE — Patient Instructions (Addendum)
1.  Avoid constipation.  Drink 8 glasses of water daily.  High-fiber diet as tolerated.  Avoid lifting heavy objects.  2.  Call our office if your lower abdominal pain recurs  3.  Your next colonoscopy is due January 2025  4.  Follow-up in our office as needed  5.  Follow-up with your primary care physician in 2 weeks for blood pressure recheck

## 2019-03-25 ENCOUNTER — Telehealth (INDEPENDENT_AMBULATORY_CARE_PROVIDER_SITE_OTHER): Payer: Self-pay | Admitting: *Deleted

## 2019-03-25 ENCOUNTER — Other Ambulatory Visit: Payer: Self-pay | Admitting: Nurse Practitioner

## 2019-03-25 DIAGNOSIS — K5732 Diverticulitis of large intestine without perforation or abscess without bleeding: Secondary | ICD-10-CM

## 2019-03-25 MED ORDER — CIPROFLOXACIN HCL 500 MG PO TABS: 500.0000 mg | ORAL_TABLET | Freq: Two times a day (BID) | ORAL | 0 refills | Status: AC

## 2019-03-25 MED ORDER — METRONIDAZOLE 500 MG PO TABS: 500.0000 mg | ORAL_TABLET | Freq: Two times a day (BID) | ORAL | 0 refills | Status: AC

## 2019-03-25 NOTE — Telephone Encounter (Signed)
Pt was seen 03/09/19 - states you asked her to cal if she experienced any pain and she is having pain - please call [phone]

## 2019-03-25 NOTE — Telephone Encounter (Signed)
I called the patient and she reported having a left lower quadrant abdominal pain which has been constant for the past 3 days.  She stated this pain is similar to her diverticulitis pain that she has had previously.  She describes a constant pressure-like pain.  She passed a small ribbonlike bowel movement earlier today.  No rectal bleeding.  No fever.  She has tolerated Cipro and Flagyl in the past.  I will send a prescription for Cipro 100 mg 1 tab p.o. twice daily for 10 days and Flagyl 500 mg 1 p.o. twice daily for 10 days to her pharmacy.  She is traveling to [facility] to stay with family for the next week and a half.  I advised the patient to go to the local emergency room if she develops worsening abdominal pain and or fever.  I advised the patient to schedule a follow-up appointment in our office when she returns back to [place], she agreed to do so.

## 2019-05-24 ENCOUNTER — Other Ambulatory Visit: Payer: Self-pay

## 2019-05-24 ENCOUNTER — Encounter (INDEPENDENT_AMBULATORY_CARE_PROVIDER_SITE_OTHER): Payer: Self-pay | Admitting: Internal Medicine

## 2019-05-24 ENCOUNTER — Ambulatory Visit (INDEPENDENT_AMBULATORY_CARE_PROVIDER_SITE_OTHER): Payer: Medicare HMO | Admitting: Internal Medicine

## 2019-05-24 DIAGNOSIS — R1032 Left lower quadrant pain: Secondary | ICD-10-CM | POA: Diagnosis not present

## 2019-05-24 DIAGNOSIS — Z8719 Personal history of other diseases of the digestive system: Secondary | ICD-10-CM | POA: Insufficient documentation

## 2019-05-24 DIAGNOSIS — Z86010 Personal history of colonic polyps: Secondary | ICD-10-CM

## 2019-05-24 MED ORDER — DICYCLOMINE HCL 10 MG PO CAPS: 10.0000 mg | ORAL_CAPSULE | Freq: Two times a day (BID) | ORAL | 1 refills | Status: DC | PRN

## 2019-05-24 NOTE — Patient Instructions (Signed)
Patient will start probiotic for now. Patient advised to take fiber supplement 4 g daily or eat 1 apple with skin every night. Patient advised to call within 2 to 4 weeks with progress report. If dicyclomine does not help we will plan to see her in the office soon otherwise we will schedule a visit in 3 to 4 months.

## 2019-05-24 NOTE — Progress Notes (Signed)
Virtual Visit via Telephone Note  Patient had scheduled face-to-face visit today.  Visit was changed to virtual/telephone visit due to ongoing Covid-19 pandemic and we both agreed. I connected with Paige Cole on 05/24/19 at 2:38 PM by telephone and verified that I am speaking with the correct person using two identifiers.  Location: Patient: home Provider: office   I discussed the limitations, risks, security and privacy concerns of performing an evaluation and management service by telephone and the availability of in person appointments. I also discussed with the patient that there may be a patient responsible charge related to this service. The patient expressed understanding and agreed to proceed.   History of Present Illness:  Patient is 69 year old Caucasian female who has history of sigmoid diverticulitis as well as history of colonic polyps. She had sigmoid diverticulitis in November 2019 documented on CT.  She was treated for diverticulitis in May 2020 and again about 2 months ago.  Prior to her last episode she was seen in the office on 03/09/2019 and her abdominal examination was benign. Her last colonoscopy was in January 2020 revealing diverticulosis at hepatic flexure and sigmoid colon and 2 small polyps were removed and these were tubular adenomas.  She was advised to undergo next exam in January 2025.  Patient continues to complain of very mild discomfort in left lower quadrant of her abdomen which she is reluctant to call pain.  She says is never unbearable and she experiences often and may last for few hours.  She had pain this morning but by midday the pain was gone.  She describes this pain as dull nagging pain.  She says when she took antibiotics in November 2020 she noted improvement but the pain was never gone away completely.  Pain is fairly localized.  No history of fever chills nausea vomiting hematuria or dysuria.  She does not recall that she has ever taken any  medication for IBS.  Her bowels move daily but at times she does not have sense of complete evacuation.  She also has noted decrease in the caliber of her stools.  She denies melena or rectal bleeding.  Her appetite is good and her weight has been stable.  She has been on probiotic in the past but she has not been taking it every day.  She says it did not help. She takes ibuprofen occasionally.  She does not have history of glaucoma.   Observations/Objective:  Patient reported her weight to be 174 pounds. She weighed 174 pounds on 03/09/2019  Assessment and Plan:  #1.  LLQ abdominal pain.  I suspect she has IBS.  She has well-documented history of sigmoid diverticulitis when she took Cipro and metronidazole 2 months ago it did not completely alleviate the pain.  She also has noted change in caliber of her stool and incomplete evacuation.  She is up-to-date on CRC is scheduled.  Last colonoscopy was 1 year ago.  She may benefit from low-dose antispasmodic and fiber supplement.  #2.  History of diverticulitis.  She had CT documentation of sigmoid diverticulitis in November 2019.  She was treated twice last year based on her symptoms.  If her pain worsens will consider repeating CT rather than empiric therapy.  #3.  History of colonic adenomas.  Last colonoscopy was 1 year ago and next exam would be in 4 years.  Follow Up Instructions:  Patient advised to take Metamucil 4 g by mouth daily at bedtime or eat 1 apple with skin every day.  Dicyclomine 10 mg by mouth twice daily as needed.  Patient informed of potential side effects.  If she has any issues she will stop the medication and call us. Patient advised to call office in 2 to 4 weeks. If he does not feel any better will consider low-dose Linzess. I discussed the assessment and treatment plan with the patient. The patient was provided an opportunity to ask questions and all were answered. The patient agreed with the plan and demonstrated an  understanding of the instructions.   The patient was advised to call back or seek an in-person evaluation if the symptoms worsen or if the condition fails to improve as anticipated.  I provided 13 minutes of non-face-to-face time during this encounter.   Hildred Laser, MD

## 2019-07-20 ENCOUNTER — Other Ambulatory Visit (INDEPENDENT_AMBULATORY_CARE_PROVIDER_SITE_OTHER): Payer: Self-pay | Admitting: Internal Medicine

## 2019-09-19 ENCOUNTER — Other Ambulatory Visit (INDEPENDENT_AMBULATORY_CARE_PROVIDER_SITE_OTHER): Payer: Self-pay | Admitting: Internal Medicine

## 2019-11-09 DIAGNOSIS — R69 Illness, unspecified: Secondary | ICD-10-CM | POA: Diagnosis not present

## 2019-11-11 DIAGNOSIS — H524 Presbyopia: Secondary | ICD-10-CM | POA: Diagnosis not present

## 2020-01-20 ENCOUNTER — Encounter (INDEPENDENT_AMBULATORY_CARE_PROVIDER_SITE_OTHER): Payer: Self-pay | Admitting: Gastroenterology

## 2020-01-20 ENCOUNTER — Other Ambulatory Visit: Payer: Self-pay

## 2020-01-20 ENCOUNTER — Ambulatory Visit (INDEPENDENT_AMBULATORY_CARE_PROVIDER_SITE_OTHER): Payer: Medicare HMO | Admitting: Gastroenterology

## 2020-01-20 VITALS — BP 139/86 | HR 64 | Temp 98.9°F | Ht 62.0 in | Wt 166.4 lb

## 2020-01-20 DIAGNOSIS — R1032 Left lower quadrant pain: Secondary | ICD-10-CM | POA: Diagnosis not present

## 2020-01-20 DIAGNOSIS — K5730 Diverticulosis of large intestine without perforation or abscess without bleeding: Secondary | ICD-10-CM | POA: Diagnosis not present

## 2020-01-20 NOTE — Progress Notes (Signed)
Patient profile: Paige Cole is a 69 y.o. female seen for Follow-up.  She has a history of diverticulitis confirmed on CT November 2019.  Also treated twice for diverticulitis in 2020.  There was concern for overlap of irritable bowel and she was recommended to take dicyclomine 10 mg twice daily as needed as well as Metamucil.  History of Present Illness: Paige Cole is seen today for evaluation of possible diverticulitis.  She reports about 1 week ago developing symptoms of some left lower quadrant pain that she describes as a stinging pain.  She reports when pain began she put herself on a clear liquid diet for about 3 days which seemed to help the pain.  She felt well for a couple days and then felt the pain return again yesterday but improved this Am. She currently is not having any abdominal pain.  She overall feels the dicyclomine that she has been taking once a day since January has drastically helped her GI symptoms.  She had been off of the dicyclomine prior to the recent symptoms beginning and restarted a few days ago. She is having a bowel movement usually daily. She had also stopped eating her apple a day as a fiber supplement a few months ago when she was feeling well.  She denies any rectal bleeding or melena.   She denies any upper GI symptoms, no nausea vomiting GERD dysphagia.   She does note eating a lot of tomatoes and cucumbers recently from her garden.  Wt Readings from Last 3 Encounters:  01/20/20 166 lb 6.4 oz (75.5 kg)  03/09/19 174 lb 1.6 oz (79 kg)  06/08/18 163 lb (73.9 kg)     Last Colonoscopy: 05/2018-- Diverticulosis in the sigmoid colon and at the hepatic flexure. - Two 5 mm polyps in the descending colon and at the splenic flexure, removed with a cold snare. Resected and retrieved. - External hemorrhoids. - Anal papilla(e) were hypertrophied.  Patient had 2 polyps removed and there tubular adenomas. Results given to patient. Next colonoscopy in 5  years.  Last Endoscopy:    Past Medical History:  History reviewed. No pertinent past medical history.  Problem List: Patient Active Problem List   Diagnosis Date Noted  . Abdominal pain, LLQ 05/24/2019  . History of diverticulitis 05/24/2019  . History of colonic polyps 03/09/2019  . Diverticulosis 03/09/2019  . Diverticulitis of colon 04/28/2018    Past Surgical History: Past Surgical History:  Procedure Laterality Date  . ABDOMINAL HYSTERECTOMY    . COLONOSCOPY N/A 06/08/2018   Procedure: COLONOSCOPY;  Surgeon: Rogene Houston, MD;  [facility];  Service: Endoscopy;  Laterality: N/A;  7:30  . POLYPECTOMY  06/08/2018   Procedure: POLYPECTOMY;  Surgeon: Rogene Houston, MD;  [facility];  Service: Endoscopy;;  cold snare [place] x 1, DC x1    Allergies: Allergies  Allergen Reactions  . Penicillins Rash    Has patient had a PCN reaction causing immediate rash, facial/tongue/throat swelling, SOB or lightheadedness with hypotension: No Has patient had a PCN reaction causing severe rash involving mucus membranes or skin necrosis: No Has patient had a PCN reaction that required hospitalization: No Has patient had a PCN reaction occurring within the last 10 years: No If all of the above answers are "NO", then may proceed with Cephalosporin use.       Home Medications:  Current Outpatient Medications:  .  acetaminophen (TYLENOL) 500 MG tablet, Take 500 mg by mouth every  6 (six) hours as needed (for pain.)., Disp: , Rfl:  .  dicyclomine (BENTYL) 10 MG capsule, TAKE 1 CAPSULE BY MOUTH 2 TIMES DAILY AS NEEDED FOR SPASMS., Disp: 60 capsule, Rfl: 3 .  ibuprofen (ADVIL,MOTRIN) 200 MG tablet, Take 200 mg by mouth every 8 (eight) hours as needed (for pain.)., Disp: , Rfl:    Family History: family history is not on file.    Social History:   reports that she has never smoked. She has never used smokeless tobacco. She reports that she does not drink alcohol and  does not use drugs.   Review of Systems: Constitutional: Denies weight loss/weight gain  Eyes: No changes in vision. ENT: No oral lesions, sore throat.  GI: see HPI.  Heme/Lymph: No easy bruising.  CV: No chest pain.  GU: No hematuria.  Integumentary: No rashes.  Neuro: No headaches.  Psych: No depression/anxiety.  Endocrine: No heat/cold intolerance.  Allergic/Immunologic: No urticaria.  Resp: No cough, SOB.  Musculoskeletal: No joint swelling.    Physical Examination: BP 139/86 (BP Location: Left Arm, Patient Position: Sitting, Cuff Size: Normal)   Pulse 64   Temp 98.9 F (37.2 C) (Oral)   Ht 5\' 2"  (1.575 m)   Wt 166 lb 6.4 oz (75.5 kg)   BMI 30.43 kg/m  Gen: NAD, alert and oriented x 4 HEENT: PEERLA, EOMI, Neck: supple, no JVD Chest: CTA bilaterally, no wheezes, crackles, or other adventitious sounds CV: RRR, no m/g/c/r Abd: soft, non tender LLQ, ND, +BS in all four quadrants; no HSM, guarding, ridigity, or rebound tenderness Ext: no edema, well perfused with 2+ pulses, Skin: no rash or lesions noted on observed skin Lymph: no noted LAD  Data Reviewed:  CT 2019 IMPRESSION: Sigmoid diverticulitis.  Negative for abscess or perforation. 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 Normal appendix  Assessment/Plan: Paige Cole is a 69 y.o. female seen for follow-up.  1.  Left lower quadrant pain-initially had concern at onset of pain for diverticulitis.  Pain is resolved at this point with a clear liquid diet, she currently just feels a very mild soreness in her lower abdomen but she is completely nontender to palpation on exam today lowering suspicion for acute diverticulitis.  She is afebrile.  We discussed if the pain returns getting a CT scan prior to treatment with antibiotics.  Suspect she has an overlap with irritable bowel.  She finds dicyclomine beneficial as needed and has helped pain.  2.  Diverticulosis-long-term recommend she avoid large amounts  of tomatoes and cucumbers which she has had recently, nuts, seeds, etc.  Did recommend long term fiber supplement again today.  No upper GI symptoms.  She is to notify me if pain returns and would get CT and treatment. She would like to f/up PRN if continues to feel well.    Paige Cole was seen today for diverticulitis.  Diagnoses and all orders for this visit:  LLQ pain  Diverticulosis of colon without hemorrhage      I personally performed the service, non-incident to. (WP)  Laurine Blazer, [facility]

## 2020-01-20 NOTE — Patient Instructions (Signed)
Benefiber and Citrucel are good fiber supplement. please call if symptoms return or worsen (abdominal pain, fever, pain with eating, etc)

## 2020-02-28 DIAGNOSIS — R3911 Hesitancy of micturition: Secondary | ICD-10-CM | POA: Diagnosis not present

## 2020-02-28 DIAGNOSIS — R112 Nausea with vomiting, unspecified: Secondary | ICD-10-CM | POA: Diagnosis not present

## 2020-02-28 DIAGNOSIS — N390 Urinary tract infection, site not specified: Secondary | ICD-10-CM | POA: Diagnosis not present

## 2020-02-28 DIAGNOSIS — R197 Diarrhea, unspecified: Secondary | ICD-10-CM | POA: Diagnosis not present

## 2020-09-25 ENCOUNTER — Other Ambulatory Visit (HOSPITAL_COMMUNITY): Payer: Self-pay | Admitting: Internal Medicine

## 2020-09-25 DIAGNOSIS — Z1231 Encounter for screening mammogram for malignant neoplasm of breast: Secondary | ICD-10-CM

## 2020-09-25 DIAGNOSIS — Z0000 Encounter for general adult medical examination without abnormal findings: Secondary | ICD-10-CM | POA: Diagnosis not present

## 2020-09-29 ENCOUNTER — Ambulatory Visit (HOSPITAL_COMMUNITY): Payer: Medicare HMO

## 2020-10-06 ENCOUNTER — Ambulatory Visit (HOSPITAL_COMMUNITY)
Admission: RE | Admit: 2020-10-06 | Discharge: 2020-10-06 | Disposition: A | Discharge status: Home / Self Care | Payer: Medicare HMO | Source: Ambulatory Visit | Attending: Internal Medicine | Admitting: Internal Medicine

## 2020-10-06 ENCOUNTER — Encounter (HOSPITAL_COMMUNITY): Payer: Self-pay

## 2020-10-06 DIAGNOSIS — Z1231 Encounter for screening mammogram for malignant neoplasm of breast: Secondary | ICD-10-CM | POA: Insufficient documentation

## 2020-10-09 DIAGNOSIS — E669 Obesity, unspecified: Secondary | ICD-10-CM | POA: Diagnosis not present

## 2020-10-09 DIAGNOSIS — M1711 Unilateral primary osteoarthritis, right knee: Secondary | ICD-10-CM | POA: Diagnosis not present

## 2020-10-17 ENCOUNTER — Inpatient Hospital Stay
Admission: RE | Admit: 2020-10-17 | Discharge: 2020-10-17 | Disposition: A | Discharge status: Home / Self Care | Payer: Self-pay | Source: Ambulatory Visit | Attending: Internal Medicine | Admitting: Internal Medicine

## 2020-10-17 ENCOUNTER — Other Ambulatory Visit (HOSPITAL_COMMUNITY): Payer: Self-pay | Admitting: Internal Medicine

## 2020-10-17 DIAGNOSIS — Z1231 Encounter for screening mammogram for malignant neoplasm of breast: Secondary | ICD-10-CM

## 2021-01-11 DIAGNOSIS — H524 Presbyopia: Secondary | ICD-10-CM | POA: Diagnosis not present

## 2021-02-01 DIAGNOSIS — H52209 Unspecified astigmatism, unspecified eye: Secondary | ICD-10-CM | POA: Diagnosis not present

## 2021-02-01 DIAGNOSIS — H524 Presbyopia: Secondary | ICD-10-CM | POA: Diagnosis not present

## 2021-02-01 DIAGNOSIS — H5213 Myopia, bilateral: Secondary | ICD-10-CM | POA: Diagnosis not present

## 2021-02-01 DIAGNOSIS — H5203 Hypermetropia, bilateral: Secondary | ICD-10-CM | POA: Diagnosis not present

## 2021-10-30 ENCOUNTER — Ambulatory Visit (INDEPENDENT_AMBULATORY_CARE_PROVIDER_SITE_OTHER): Payer: Medicare HMO | Admitting: Family Medicine

## 2021-10-30 ENCOUNTER — Encounter: Payer: Self-pay | Admitting: Family Medicine

## 2021-10-30 VITALS — BP 130/80 | HR 70 | Temp 97.6°F | Ht 61.0 in | Wt 164.4 lb

## 2021-10-30 DIAGNOSIS — Z130 Encounter for screening for diseases of the blood and blood-forming organs and certain disorders involving the immune mechanism: Secondary | ICD-10-CM | POA: Diagnosis not present

## 2021-10-30 DIAGNOSIS — E669 Obesity, unspecified: Secondary | ICD-10-CM | POA: Diagnosis not present

## 2021-10-30 DIAGNOSIS — Z0000 Encounter for general adult medical examination without abnormal findings: Secondary | ICD-10-CM | POA: Diagnosis not present

## 2021-10-30 DIAGNOSIS — Z13220 Encounter for screening for lipoid disorders: Secondary | ICD-10-CM | POA: Diagnosis not present

## 2021-10-30 DIAGNOSIS — Z13820 Encounter for screening for osteoporosis: Secondary | ICD-10-CM | POA: Diagnosis not present

## 2021-10-30 DIAGNOSIS — Z6831 Body mass index (BMI) 31.0-31.9, adult: Secondary | ICD-10-CM | POA: Diagnosis not present

## 2021-10-30 NOTE — Assessment & Plan Note (Signed)
Labs today. Discussed preventative health care with the patient.  She is amendable to a DEXA scan.  Order has been placed.  Advised to call and get her mammogram scheduled.

## 2021-10-30 NOTE — Progress Notes (Signed)
Subjective:  Patient ID: Paige Cole, female    DOB: Mar 23, 1951  Age: 71 y.o. MRN: [id number]  CC: Chief Complaint  Patient presents with   Establish Care    Pt used to see Dr.Steve but then transferred to [facility].     HPI:   71 year old female with a history of diverticulitis presents to establish care.   She is feeling well.  No recent abdominal pain.  No chest pain or shortness of breath.  No vision changes or hearing changes.  Patient has had 2 COVID vaccines.  Declines hepatitis C screening.  Advised that she should get a tetanus vaccine at her pharmacy.  Declines shingles vaccination as well as pneumococcal vaccination.  Recommended DEXA scan to evaluate for osteoporosis and she is okay with pursuing this.  Patient has not had any recent labs.  Patient Active Problem List   Diagnosis Date Noted   Encounter for preventative adult health care examination 10/30/2021   History of diverticulitis 05/24/2019    Social Hx   Social History   Socioeconomic History   Marital status: Married    Spouse name: Not on file   Number of children: Not on file   Years of education: Not on file   Highest education level: Not on file  Occupational History   Not on file  Tobacco Use   Smoking status: Never   Smokeless tobacco: Never  Substance and Sexual Activity   Alcohol use: Never   Drug use: Never   Sexual activity: Not on file  Other Topics Concern   Not on file  Social History Narrative   Not on file   Social Determinants of Health   Financial Resource Strain: Not on file  Food Insecurity: Not on file  Transportation Needs: Not on file  Physical Activity: Not on file  Stress: Not on file  Social Connections: Not on file    Review of Systems Per HPI  Objective:  BP 130/80   Pulse 70   Temp 97.6 F (36.4 C)   Ht '5\' 1"'  (1.549 m)   Wt 164 lb 6.4 oz (74.6 kg)   SpO2 99%   BMI 31.06 kg/m      10/30/2021    9:17 AM 01/20/2020    4:26 PM  03/09/2019    1:54 PM  BP/Weight  Systolic BP 462 863 817  Diastolic BP 80 86 85  Wt. (Lbs) 164.4 166.4 174.1  BMI 31.06 kg/m2 30.43 kg/m2 31.84 kg/m2    Physical Exam Vitals reviewed.  Constitutional:      General: She is not in acute distress.    Appearance: Normal appearance. She is well-developed. She is not ill-appearing.  HENT:     Head: Normocephalic and atraumatic.     Nose: Nose normal.     Mouth/Throat:     Pharynx: No oropharyngeal exudate.  Eyes:     General: No scleral icterus.    Conjunctiva/sclera: Conjunctivae normal.  Neck:     Thyroid: No thyromegaly.  Cardiovascular:     Rate and Rhythm: Normal rate and regular rhythm.     Heart sounds: No murmur heard. Pulmonary:     Effort: Pulmonary effort is normal.     Breath sounds: Normal breath sounds. No wheezing or rales.  Abdominal:     General: There is no distension.     Palpations: Abdomen is soft.     Tenderness: There is no abdominal tenderness. There is no guarding or rebound.  Musculoskeletal:     Cervical back: Neck supple.  Lymphadenopathy:     Cervical: No cervical adenopathy.  Skin:    General: Skin is warm and dry.     Findings: No rash.  Neurological:     General: No focal deficit present.     Mental Status: She is alert.  Psychiatric:        Behavior: Behavior normal.     Lab Results  Component Value Date   WBC 9.6 04/03/2018   HGB 13.1 04/03/2018   HCT 41.7 04/03/2018   PLT 266 04/03/2018   GLUCOSE 154 (H) 04/03/2018   ALT 25 04/03/2018   AST 25 04/03/2018   NA 137 04/03/2018   K 3.8 04/03/2018   CL 104 04/03/2018   CREATININE 0.69 04/03/2018   BUN 18 04/03/2018   CO2 24 04/03/2018     Assessment & Plan:   Problem List Items Addressed This Visit       Other   Encounter for preventative adult health care examination - Primary    Labs today. Discussed preventative health care with the patient.  She is amendable to a DEXA scan.  Order has been placed.  Advised to call  and get her mammogram scheduled.      Other Visit Diagnoses     Screening for deficiency anemia       Relevant Orders   CBC   Class 1 obesity without serious comorbidity with body mass index (BMI) of 31.0 to 31.9 in adult, unspecified obesity type       Relevant Orders   CMP14+EGFR   Screening, lipid       Relevant Orders   Lipid panel   Osteoporosis screening       Relevant Orders   DG Bone Density      Follow-up:  Return in about 1 year (around 10/31/2022).  [place]

## 2021-10-30 NOTE — Patient Instructions (Signed)
Labs today.  I have ordered the Dexa scan.  Call [phone] to schedule your mammogram.  Follow up annually.  Take care  Dr. Lacinda Axon

## 2021-11-02 ENCOUNTER — Ambulatory Visit
Admission: EM | Admit: 2021-11-02 | Discharge: 2021-11-02 | Disposition: A | Discharge status: Home / Self Care | Payer: Medicare HMO | Attending: Family Medicine | Admitting: Family Medicine

## 2021-11-02 DIAGNOSIS — R52 Pain, unspecified: Secondary | ICD-10-CM

## 2021-11-02 DIAGNOSIS — R110 Nausea: Secondary | ICD-10-CM | POA: Diagnosis not present

## 2021-11-02 DIAGNOSIS — R519 Headache, unspecified: Secondary | ICD-10-CM | POA: Diagnosis not present

## 2021-11-02 DIAGNOSIS — R509 Fever, unspecified: Secondary | ICD-10-CM | POA: Diagnosis not present

## 2021-11-02 NOTE — ED Provider Notes (Signed)
[facility]    CSN: [id number] Arrival date & time: 11/02/21  1546     History   Chief Complaint Chief Complaint  Patient presents with   Generalized Body Aches    Tick bite on Wednesday with body aches   HPI Mardella Zaugg is a 71 y.o. female.   Presenting today with 1 day history of fatigue, body aches, fever, nausea, headache, chills.  She denies cough, congestion, sore throat, chest pain, shortness of breath, vomiting, diarrhea, rashes.  Only thing new recently is she did get a tick off of her right upper leg 2 days ago.  No known sick contacts recently.  Taking ibuprofen with minimal relief of symptoms.    History reviewed. No pertinent past medical history.  Patient Active Problem List   Diagnosis Date Noted   Encounter for preventative adult health care examination 10/30/2021   History of diverticulitis 05/24/2019    Past Surgical History:  Procedure Laterality Date   ABDOMINAL HYSTERECTOMY     COLONOSCOPY N/A 06/08/2018   Procedure: COLONOSCOPY;  Surgeon: Rogene Houston, MD;  [facility];  Service: Endoscopy;  Laterality: N/A;  7:30   POLYPECTOMY  06/08/2018   Procedure: POLYPECTOMY;  Surgeon: Rogene Houston, MD;  Location: [facility];  Service: Endoscopy;;  cold snare [place] x 1, DC x1    OB History   No obstetric history on file.      Home Medications    Prior to Admission medications   Medication Sig Start Date End Date Taking? Authorizing Provider  acetaminophen (TYLENOL) 500 MG tablet Take 500 mg by mouth every 6 (six) hours as needed (for pain.).    [provider]  dicyclomine (BENTYL) 10 MG capsule TAKE 1 CAPSULE BY MOUTH 2 TIMES DAILY AS NEEDED FOR SPASMS. 09/20/19   Rehman, Mechele Dawley, MD  ibuprofen (ADVIL,MOTRIN) 200 MG tablet Take 200 mg by mouth every 8 (eight) hours as needed (for pain.).    [provider]    Family History Family History  Problem Relation Age of Onset   Breast cancer Niece      Social History Social History   Tobacco Use   Smoking status: Never   Smokeless tobacco: Never  Vaping Use   Vaping Use: Never used  Substance Use Topics   Alcohol use: Never   Drug use: Never     Allergies   Penicillins   Review of Systems Review of Systems Per HPI  Physical Exam Triage Vital Signs ED Triage Vitals  Enc Vitals Group     BP 11/02/21 1557 (!) 150/92     Pulse Rate 11/02/21 1557 (!) 112     Resp 11/02/21 1557 18     Temp 11/02/21 1557 (!) 100.4 F (38 C)     Temp Source 11/02/21 1557 Oral     SpO2 11/02/21 1557 94 %     Weight --      Height --      Head Circumference --      Peak Flow --      Pain Score 11/02/21 1558 8     Pain Loc --      Pain Edu? --      Excl. in [place]? --    No data found.  Updated Vital Signs BP (!) 150/92 (BP Location: Right Arm)   Pulse (!) 112   Temp (!) 100.4 F (38 C) (Oral)   Resp 18   SpO2 94%   Visual  Acuity Right Eye Distance:   Left Eye Distance:   Bilateral Distance:    Right Eye Near:   Left Eye Near:    Bilateral Near:     Physical Exam Vitals and nursing note reviewed.  Constitutional:      Appearance: Normal appearance. She is not ill-appearing.  HENT:     Head: Atraumatic.     Right Ear: Tympanic membrane normal.     Left Ear: Tympanic membrane normal.     Nose: Nose normal.     Mouth/Throat:     Mouth: Mucous membranes are moist.  Eyes:     Extraocular Movements: Extraocular movements intact.     Conjunctiva/sclera: Conjunctivae normal.  Cardiovascular:     Rate and Rhythm: Normal rate and regular rhythm.     Heart sounds: Normal heart sounds.  Pulmonary:     Effort: Pulmonary effort is normal.     Breath sounds: Normal breath sounds. No wheezing or rales.  Abdominal:     General: Bowel sounds are normal. There is no distension.     Palpations: Abdomen is soft.     Tenderness: There is no abdominal tenderness. There is no guarding.  Musculoskeletal:        General: Normal  range of motion.     Cervical back: Normal range of motion and neck supple.  Skin:    General: Skin is warm and dry.     Comments: Small area of erythema at site of tick bite.  No evidence of infection, no fluctuance induration edema, bleeding or drainage  Neurological:     Mental Status: She is alert and oriented to person, place, and time.  Psychiatric:        Mood and Affect: Mood normal.        Thought Content: Thought content normal.        Judgment: Judgment normal.      UC Treatments / Results  Labs (all labs ordered are listed, but only abnormal results are displayed) Labs Reviewed  COVID-19, FLU A+B NAA    EKG   Radiology No results found.  Procedures Procedures (including critical care time)  Medications Ordered in UC Medications - No data to display  Initial Impression / Assessment and Plan / UC Course  I have reviewed the triage vital signs and the nursing notes.  Pertinent labs & imaging results that were available during my care of the patient were reviewed by me and considered in my medical decision making (see chart for details).     Very low suspicion for tickborne illness given duration of symptoms compared to tick bite.  She is febrile, tachycardic and mildly hypertensive in triage, will test for COVID and flu and discussed if symptoms continue to have tick labs drawn around the 1 to 2-week mark as they would not show positive at this time.  Discussed over-the-counter fever reducers, supportive care and return precautions.  Final Clinical Impressions(s) / UC Diagnoses   Final diagnoses:  Fever, unspecified  Generalized body aches  Acute nonintractable headache, unspecified headache type  Nausea without vomiting   Discharge Instructions   None    ED Prescriptions   None    PDMP not reviewed this encounter.   Volney American, [place] 11/02/21 1625

## 2021-11-02 NOTE — ED Triage Notes (Signed)
Pt states that she was bit by a tick on Wednesday  Pt states she is having body aches, fatigue, slight nausea and a headache  Pt states she took ibuprofen for the pain but no relief

## 2021-11-06 LAB — COVID-19, FLU A+B NAA
Influenza A, NAA: NOT DETECTED
Influenza B, NAA: NOT DETECTED
SARS-CoV-2, NAA: NOT DETECTED

## 2021-11-09 ENCOUNTER — Ambulatory Visit (INDEPENDENT_AMBULATORY_CARE_PROVIDER_SITE_OTHER): Payer: Medicare HMO | Admitting: Family Medicine

## 2021-11-09 ENCOUNTER — Other Ambulatory Visit (HOSPITAL_COMMUNITY)
Admission: RE | Admit: 2021-11-09 | Discharge: 2021-11-09 | Disposition: A | Discharge status: Home / Self Care | Payer: Medicare HMO | Source: Ambulatory Visit | Attending: Family Medicine | Admitting: Family Medicine

## 2021-11-09 ENCOUNTER — Ambulatory Visit (HOSPITAL_COMMUNITY)
Admission: RE | Admit: 2021-11-09 | Discharge: 2021-11-09 | Disposition: A | Discharge status: Home / Self Care | Payer: Medicare HMO | Source: Ambulatory Visit | Attending: Family Medicine | Admitting: Family Medicine

## 2021-11-09 ENCOUNTER — Encounter: Payer: Self-pay | Admitting: Family Medicine

## 2021-11-09 VITALS — BP 142/90 | HR 77 | Temp 98.1°F | Ht 61.0 in | Wt 161.0 lb

## 2021-11-09 DIAGNOSIS — R1032 Left lower quadrant pain: Secondary | ICD-10-CM | POA: Insufficient documentation

## 2021-11-09 DIAGNOSIS — D1809 Hemangioma of other sites: Secondary | ICD-10-CM | POA: Diagnosis not present

## 2021-11-09 DIAGNOSIS — K6389 Other specified diseases of intestine: Secondary | ICD-10-CM | POA: Diagnosis not present

## 2021-11-09 DIAGNOSIS — R1030 Lower abdominal pain, unspecified: Secondary | ICD-10-CM | POA: Diagnosis not present

## 2021-11-09 LAB — COMPREHENSIVE METABOLIC PANEL
ALT: 38 U/L (ref 0–44)
AST: 18 U/L (ref 15–41)
Albumin: 3.4 g/dL — ABNORMAL LOW (ref 3.5–5.0)
Alkaline Phosphatase: 125 U/L (ref 38–126)
Anion gap: 6 (ref 5–15)
BUN: 14 mg/dL (ref 8–23)
CO2: 30 mmol/L (ref 22–32)
Calcium: 8.8 mg/dL — ABNORMAL LOW (ref 8.9–10.3)
Chloride: 102 mmol/L (ref 98–111)
Creatinine, Ser: 0.65 mg/dL (ref 0.44–1.00)
GFR, Estimated: >60 mL/min (ref >60)
Glucose, Bld: 108 mg/dL — ABNORMAL HIGH (ref 70–99)
Potassium: 4.7 mmol/L (ref 3.5–5.1)
Sodium: 138 mmol/L (ref 135–145)
Total Bilirubin: 0.5 mg/dL (ref 0.3–1.2)
Total Protein: 7.3 g/dL (ref 6.5–8.1)

## 2021-11-09 LAB — CBC
HCT: 41 % (ref 36.0–46.0)
Hemoglobin: 13 g/dL (ref 12.0–15.0)
MCH: 27.8 pg (ref 26.0–34.0)
MCHC: 31.7 g/dL (ref 30.0–36.0)
MCV: 87.6 fL (ref 80.0–100.0)
Platelets: 307 10*3/uL (ref 150–400)
RBC: 4.68 MIL/uL (ref 3.87–5.11)
RDW: 13.3 % (ref 11.5–15.5)
WBC: 6.5 10*3/uL (ref 4.0–10.5)
nRBC: 0 % (ref 0.0–0.2)

## 2021-11-09 LAB — LIPASE, BLOOD: Lipase: 24 U/L (ref 11–51)

## 2021-11-09 MED ORDER — CIPROFLOXACIN HCL 500 MG PO TABS: ORAL_TABLET | ORAL | 0 refills | Status: DC

## 2021-11-09 MED ORDER — IOHEXOL 300 MG/ML  SOLN
100.0000 mL | Freq: Once | INTRAMUSCULAR | Status: AC | PRN
  Administered 2021-11-09: 100 mL via INTRAVENOUS

## 2021-11-09 MED ORDER — METRONIDAZOLE 500 MG PO TABS
ORAL_TABLET | ORAL | 0 refills | Status: DC
Start: 2021-11-09 — End: 2021-11-29

## 2021-11-09 MED ORDER — IOHEXOL 9 MG/ML PO SOLN
ORAL | Status: AC
  Filled 2021-11-09: qty 1000

## 2021-11-12 ENCOUNTER — Ambulatory Visit (INDEPENDENT_AMBULATORY_CARE_PROVIDER_SITE_OTHER): Payer: Medicare HMO | Admitting: Family Medicine

## 2021-11-12 ENCOUNTER — Encounter: Payer: Self-pay | Admitting: Family Medicine

## 2021-11-12 DIAGNOSIS — R1032 Left lower quadrant pain: Secondary | ICD-10-CM | POA: Diagnosis not present

## 2021-11-12 MED ORDER — ONDANSETRON HCL 4 MG PO TABS: 4.0000 mg | ORAL_TABLET | Freq: Three times a day (TID) | ORAL | 3 refills | Status: DC | PRN

## 2021-11-28 ENCOUNTER — Telehealth: Payer: Self-pay

## 2021-11-28 ENCOUNTER — Other Ambulatory Visit: Payer: Self-pay

## 2021-11-28 ENCOUNTER — Inpatient Hospital Stay (HOSPITAL_COMMUNITY)
Admission: EM | Admit: 2021-11-28 | Discharge: 2021-11-30 | DRG: 392 | Disposition: A | Discharge status: Home / Self Care | Payer: Medicare HMO | Attending: Internal Medicine | Admitting: Internal Medicine

## 2021-11-28 ENCOUNTER — Encounter (HOSPITAL_COMMUNITY): Payer: Self-pay

## 2021-11-28 ENCOUNTER — Ambulatory Visit (INDEPENDENT_AMBULATORY_CARE_PROVIDER_SITE_OTHER): Payer: Medicare HMO | Admitting: Nurse Practitioner

## 2021-11-28 VITALS — BP 133/81 | HR 95 | Temp 97.3°F | Ht 61.0 in | Wt 161.0 lb

## 2021-11-28 DIAGNOSIS — R197 Diarrhea, unspecified: Secondary | ICD-10-CM | POA: Diagnosis not present

## 2021-11-28 DIAGNOSIS — K5732 Diverticulitis of large intestine without perforation or abscess without bleeding: Principal | ICD-10-CM | POA: Diagnosis present

## 2021-11-28 DIAGNOSIS — Z79899 Other long term (current) drug therapy: Secondary | ICD-10-CM

## 2021-11-28 DIAGNOSIS — R110 Nausea: Secondary | ICD-10-CM

## 2021-11-28 DIAGNOSIS — R1032 Left lower quadrant pain: Secondary | ICD-10-CM | POA: Diagnosis present

## 2021-11-28 DIAGNOSIS — Z880 Allergy status to penicillin: Secondary | ICD-10-CM

## 2021-11-28 DIAGNOSIS — Z6830 Body mass index (BMI) 30.0-30.9, adult: Secondary | ICD-10-CM

## 2021-11-28 DIAGNOSIS — K5792 Diverticulitis of intestine, part unspecified, without perforation or abscess without bleeding: Secondary | ICD-10-CM | POA: Diagnosis not present

## 2021-11-28 DIAGNOSIS — R112 Nausea with vomiting, unspecified: Secondary | ICD-10-CM

## 2021-11-28 DIAGNOSIS — E669 Obesity, unspecified: Secondary | ICD-10-CM

## 2021-11-28 LAB — COMPREHENSIVE METABOLIC PANEL
ALT: 12 U/L (ref 0–44)
AST: 14 U/L — ABNORMAL LOW (ref 15–41)
Albumin: 4.1 g/dL (ref 3.5–5.0)
Alkaline Phosphatase: 69 U/L (ref 38–126)
Anion gap: 7 (ref 5–15)
BUN: 13 mg/dL (ref 8–23)
CO2: 26 mmol/L (ref 22–32)
Calcium: 8.9 mg/dL (ref 8.9–10.3)
Chloride: 102 mmol/L (ref 98–111)
Creatinine, Ser: 0.73 mg/dL (ref 0.44–1.00)
GFR, Estimated: >60 mL/min (ref >60)
Glucose, Bld: 113 mg/dL — ABNORMAL HIGH (ref 70–99)
Potassium: 3.9 mmol/L (ref 3.5–5.1)
Sodium: 135 mmol/L (ref 135–145)
Total Bilirubin: 0.9 mg/dL (ref 0.3–1.2)
Total Protein: 7.4 g/dL (ref 6.5–8.1)

## 2021-11-28 LAB — CBC
HCT: 41.8 % (ref 36.0–46.0)
Hemoglobin: 13.2 g/dL (ref 12.0–15.0)
MCH: 27.6 pg (ref 26.0–34.0)
MCHC: 31.6 g/dL (ref 30.0–36.0)
MCV: 87.4 fL (ref 80.0–100.0)
Platelets: 241 10*3/uL (ref 150–400)
RBC: 4.78 MIL/uL (ref 3.87–5.11)
RDW: 14.3 % (ref 11.5–15.5)
WBC: 9.2 10*3/uL (ref 4.0–10.5)
nRBC: 0 % (ref 0.0–0.2)

## 2021-11-28 LAB — LIPASE, BLOOD: Lipase: 26 U/L (ref 11–51)

## 2021-11-28 MED ORDER — LACTATED RINGERS IV BOLUS
1000.0000 mL | Freq: Once | INTRAVENOUS | Status: AC
  Administered 2021-11-29: 1000 mL via INTRAVENOUS

## 2021-11-28 MED ORDER — ONDANSETRON HCL 4 MG/2ML IJ SOLN
4.0000 mg | Freq: Once | INTRAMUSCULAR | Status: AC
  Administered 2021-11-29: 4 mg via INTRAVENOUS
  Filled 2021-11-28: qty 2

## 2021-11-28 MED ORDER — FENTANYL CITRATE PF 50 MCG/ML IJ SOSY
50.0000 ug | PREFILLED_SYRINGE | Freq: Once | INTRAMUSCULAR | Status: AC
  Administered 2021-11-29: 50 ug via INTRAVENOUS
  Filled 2021-11-28: qty 1

## 2021-11-28 NOTE — ED Triage Notes (Addendum)
Charted on wrong pt.

## 2021-11-28 NOTE — Progress Notes (Unsigned)
   Subjective:    Patient ID: Paige Cole, female    DOB: 08/30/1950, 71 y.o.   MRN: [id number]  HPI Left lower quadrant abdominal pain started last night BM x 3 last night, BM x 2 this morning , abdominal bloating and ache Completed both antibiotics previously prescribed 2 week ago    Review of Systems     Objective:   Physical Exam        Assessment & Plan:

## 2021-11-28 NOTE — ED Triage Notes (Signed)
Pt presents to ED with LLQ pain that started June 23rd, pt saw Dr Lacinda Axon (PCP) and was given anbx for diverticulitis as pt has hx of this, pt took for 10 days, pain got better, then came back last night as if it is starting all over again.

## 2021-11-28 NOTE — Telephone Encounter (Signed)
Patient called and wanted an appointment here at [facility] today or tomorrow stating she was recently diagnosed by her pcp Doctor Lacinda Axon with Diverticulitis and she recently finished antibiotics on Monday, she thinks she is still in need of more antibiotics as she feels it has not resolved. I advised that there were no appointments available for today or tomorrow and she will need to call Dr. Lacinda Axon back and see if he can see her or call in another round of antibiotics as we have not seen the patient in this office since 01/2020, and with it being so long ago she will need to set up an appointment with pcp, as no doctor will prescribe medication to her without seeing her. Patient states understanding. I advised that she will need to call this office for a follow up appointment to get reestablished she states understanding.

## 2021-11-28 NOTE — Telephone Encounter (Signed)
Agree, thanks

## 2021-11-29 ENCOUNTER — Emergency Department (HOSPITAL_COMMUNITY): Payer: Medicare HMO

## 2021-11-29 ENCOUNTER — Encounter: Payer: Self-pay | Admitting: Nurse Practitioner

## 2021-11-29 DIAGNOSIS — E669 Obesity, unspecified: Secondary | ICD-10-CM

## 2021-11-29 DIAGNOSIS — R112 Nausea with vomiting, unspecified: Secondary | ICD-10-CM

## 2021-11-29 DIAGNOSIS — Z6830 Body mass index (BMI) 30.0-30.9, adult: Secondary | ICD-10-CM | POA: Diagnosis not present

## 2021-11-29 DIAGNOSIS — R1032 Left lower quadrant pain: Secondary | ICD-10-CM | POA: Diagnosis not present

## 2021-11-29 DIAGNOSIS — K5732 Diverticulitis of large intestine without perforation or abscess without bleeding: Secondary | ICD-10-CM | POA: Diagnosis not present

## 2021-11-29 DIAGNOSIS — R110 Nausea: Secondary | ICD-10-CM

## 2021-11-29 DIAGNOSIS — K5792 Diverticulitis of intestine, part unspecified, without perforation or abscess without bleeding: Secondary | ICD-10-CM | POA: Diagnosis not present

## 2021-11-29 DIAGNOSIS — Z880 Allergy status to penicillin: Secondary | ICD-10-CM | POA: Diagnosis not present

## 2021-11-29 DIAGNOSIS — Z79899 Other long term (current) drug therapy: Secondary | ICD-10-CM | POA: Diagnosis not present

## 2021-11-29 LAB — CMP14+EGFR
ALT: 7 IU/L (ref 0–32)
AST: 12 IU/L (ref 0–40)
Albumin/Globulin Ratio: 1.5 (ref 1.2–2.2)
Albumin: 4.1 g/dL (ref 3.9–4.9)
Alkaline Phosphatase: 81 IU/L (ref 44–121)
BUN/Creatinine Ratio: 22 (ref 12–28)
BUN: 14 mg/dL (ref 8–27)
Bilirubin Total: 0.5 mg/dL (ref 0.0–1.2)
CO2: 24 mmol/L (ref 20–29)
Calcium: 9.5 mg/dL (ref 8.7–10.3)
Chloride: 102 mmol/L (ref 96–106)
Creatinine, Ser: 0.63 mg/dL (ref 0.57–1.00)
Globulin, Total: 2.7 g/dL (ref 1.5–4.5)
Glucose: 90 mg/dL (ref 70–99)
Potassium: 4.2 mmol/L (ref 3.5–5.2)
Sodium: 140 mmol/L (ref 134–144)
Total Protein: 6.8 g/dL (ref 6.0–8.5)
eGFR: 96 mL/min/{1.73_m2} (ref >59)

## 2021-11-29 LAB — CBC WITH DIFFERENTIAL/PLATELET
Basophils Absolute: 0.1 10*3/uL (ref 0.0–0.2)
Basos: 1 %
EOS (ABSOLUTE): 0.1 10*3/uL (ref 0.0–0.4)
Eos: 2 %
Hematocrit: 42.8 % (ref 34.0–46.6)
Hemoglobin: 13.8 g/dL (ref 11.1–15.9)
Immature Grans (Abs): 0 10*3/uL (ref 0.0–0.1)
Immature Granulocytes: 0 %
Lymphocytes Absolute: 1 10*3/uL (ref 0.7–3.1)
Lymphs: 11 %
MCH: 27.3 pg (ref 26.6–33.0)
MCHC: 32.2 g/dL (ref 31.5–35.7)
MCV: 85 fL (ref 79–97)
Monocytes Absolute: 0.7 10*3/uL (ref 0.1–0.9)
Monocytes: 8 %
Neutrophils Absolute: 7 10*3/uL (ref 1.4–7.0)
Neutrophils: 78 %
Platelets: 263 10*3/uL (ref 150–450)
RBC: 5.05 x10E6/uL (ref 3.77–5.28)
RDW: 13.7 % (ref 11.7–15.4)
WBC: 8.9 10*3/uL (ref 3.4–10.8)

## 2021-11-29 LAB — URINALYSIS, ROUTINE W REFLEX MICROSCOPIC
Bilirubin Urine: NEGATIVE
Glucose, UA: NEGATIVE mg/dL
Hgb urine dipstick: NEGATIVE
Ketones, ur: NEGATIVE mg/dL
Leukocytes,Ua: NEGATIVE
Nitrite: NEGATIVE
Protein, ur: NEGATIVE mg/dL
Specific Gravity, Urine: 1.038 — ABNORMAL HIGH (ref 1.005–1.030)
pH: 5 (ref 5.0–8.0)

## 2021-11-29 LAB — HIV ANTIBODY (ROUTINE TESTING W REFLEX): HIV Screen 4th Generation wRfx: NONREACTIVE

## 2021-11-29 LAB — MAGNESIUM: Magnesium: 2.1 mg/dL (ref 1.7–2.4)

## 2021-11-29 LAB — PHOSPHORUS: Phosphorus: 3 mg/dL (ref 2.5–4.6)

## 2021-11-29 MED ORDER — KETOROLAC TROMETHAMINE 30 MG/ML IJ SOLN
15.0000 mg | Freq: Once | INTRAMUSCULAR | Status: AC
  Administered 2021-11-29: 15 mg via INTRAVENOUS
  Filled 2021-11-29: qty 1

## 2021-11-29 MED ORDER — ONDANSETRON HCL 4 MG/2ML IJ SOLN
4.0000 mg | Freq: Once | INTRAMUSCULAR | Status: AC
  Administered 2021-11-29: 4 mg via INTRAVENOUS
  Filled 2021-11-29: qty 2

## 2021-11-29 MED ORDER — SODIUM CHLORIDE 0.9 % IV SOLN: INTRAVENOUS | Status: AC

## 2021-11-29 MED ORDER — CIPROFLOXACIN IN D5W 400 MG/200ML IV SOLN
400.0000 mg | Freq: Two times a day (BID) | INTRAVENOUS | Status: DC
  Administered 2021-11-29 – 2021-11-30 (×2): 400 mg via INTRAVENOUS
  Filled 2021-11-29 (×2): qty 200

## 2021-11-29 MED ORDER — SODIUM CHLORIDE 0.9 % IV BOLUS
1000.0000 mL | Freq: Once | INTRAVENOUS | Status: AC
  Administered 2021-11-29: 1000 mL via INTRAVENOUS

## 2021-11-29 MED ORDER — CIPROFLOXACIN HCL 500 MG PO TABS: 500.0000 mg | ORAL_TABLET | Freq: Two times a day (BID) | ORAL | 0 refills | Status: DC

## 2021-11-29 MED ORDER — ENOXAPARIN SODIUM 40 MG/0.4ML IJ SOSY
40.0000 mg | PREFILLED_SYRINGE | INTRAMUSCULAR | Status: DC
  Administered 2021-11-29 – 2021-11-30 (×2): 40 mg via SUBCUTANEOUS
  Filled 2021-11-29 (×2): qty 0.4

## 2021-11-29 MED ORDER — PROMETHAZINE HCL 25 MG/ML IJ SOLN
INTRAMUSCULAR | Status: AC
  Filled 2021-11-29: qty 1

## 2021-11-29 MED ORDER — ASPIRIN-ACETAMINOPHEN-CAFFEINE 250-250-65 MG PO TABS
1.0000 | ORAL_TABLET | Freq: Four times a day (QID) | ORAL | Status: DC | PRN
  Administered 2021-11-29 – 2021-11-30 (×2): 1 via ORAL
  Filled 2021-11-29 (×3): qty 1

## 2021-11-29 MED ORDER — ORAL CARE MOUTH RINSE
15.0000 mL | OROMUCOSAL | Status: DC | PRN
Start: 2021-11-29 — End: 2021-12-01

## 2021-11-29 MED ORDER — CIPROFLOXACIN IN D5W 400 MG/200ML IV SOLN
400.0000 mg | Freq: Once | INTRAVENOUS | Status: AC
  Administered 2021-11-29: 400 mg via INTRAVENOUS
  Filled 2021-11-29: qty 200

## 2021-11-29 MED ORDER — IOHEXOL 300 MG/ML  SOLN
100.0000 mL | Freq: Once | INTRAMUSCULAR | Status: AC | PRN
  Administered 2021-11-29: 100 mL via INTRAVENOUS

## 2021-11-29 MED ORDER — SODIUM CHLORIDE 0.9 % IV SOLN
12.5000 mg | Freq: Four times a day (QID) | INTRAVENOUS | Status: DC | PRN
  Administered 2021-11-29: 12.5 mg via INTRAVENOUS
  Filled 2021-11-29: qty 0.5

## 2021-11-29 MED ORDER — METRONIDAZOLE 500 MG/100ML IV SOLN
500.0000 mg | Freq: Two times a day (BID) | INTRAVENOUS | Status: DC
  Administered 2021-11-29 – 2021-11-30 (×4): 500 mg via INTRAVENOUS
  Filled 2021-11-29 (×5): qty 100

## 2021-11-29 MED ORDER — CIPROFLOXACIN IN D5W 400 MG/200ML IV SOLN: 400.0000 mg | INTRAVENOUS | Status: DC

## 2021-11-29 MED ORDER — METRONIDAZOLE 500 MG PO TABS: 500.0000 mg | ORAL_TABLET | Freq: Two times a day (BID) | ORAL | 0 refills | Status: DC

## 2021-11-29 MED ORDER — MORPHINE SULFATE (PF) 2 MG/ML IV SOLN: 2.0000 mg | INTRAVENOUS | Status: DC | PRN

## 2021-11-29 NOTE — Progress Notes (Signed)
Paige Cole is a 71 y.o. female with medical history significant of sigmoid diverticulitis who presents to the emergency department due to several days of onset of abdominal pain, nausea and vomiting.  Patient was diagnosed with diverticulitis by her PCP (Dr. Lacinda Axon) and she took antibiotics (Cipro and Flagyl) for about 10 days, she had a transitory improvement in abdominal pain, but the pain returned in left lower quadrant, pain was sharp in nature and was rated as 4-5 on pain scale, it was intermittent and worsens with movement.  It was associated with nausea and vomiting.  Patient was admitted for acute diverticulitis after failing outpatient treatment.  She was started on IV ciprofloxacin and Flagyl.  GI following for possible need of flexible sigmoidoscopy if she does not improve as clinically suspected.  Patient seen and evaluated at bedside after being admitted after midnight.  A.m. labs ordered.  Total care time: 25 minutes.

## 2021-11-29 NOTE — Consult Note (Addendum)
Gastroenterology Consult   Referring Provider: No ref. provider found Primary Care Physician:  [name], DO Primary Gastroenterologist:  [place]  Patient ID: Paige Cole; [id number]; Sep 21, 1950   Admit date: 11/28/2021  LOS: 0 days   Date of Consultation: 11/29/2021  Reason for Consultation: diverticulitis, failed outpatient management    History of Present Illness   Paige Cole is a 71 y.o. female with past history significant for diverticulosis/diverticulitis who presented to the emergency department for refractory abdominal pain, nausea/vomiting.  Symptoms initially began around June 20 with fever and abdominal pain.  She was seen by her PCP on June 23, CT at that time concerning for long segment of acute diverticulitis versus colitis involving the sigmoid colon, measuring 11.4 cm in length with pericolic inflammatory changes and a few scattered colonic diverticula.  There was a 12 x 11 x 10 mm small fluid collection which felt to be tiny inflammatory collection/abscess versus fluid-filled diverticulum.  She was placed on Cipro and Flagyl for 10 days.   Initially she noted some improvement in her symptoms.  After completing antibiotics however she had recurrence of left lower quadrant pain associated with vomiting.  She did note that Flagyl caused significant nausea for her and she had to be really careful to control.  She presented to the ED for further management.  Patient has ongoing left lower quadrant pain.  She has had poor oral intake during this illness.  She reports fever did resolve during initial course of antibiotics.  She has had similar episodes of abdominal pain which was felt to be diverticulitis in the past but nothing this severe.  She continues to have regular bowel movements.  Denies any blood in the stool or melena.  She has had no diarrhea.  No heartburn, hematemesis.   In the ED: Vital signs stable.  Afebrile.  LFTs, creatinine, CBC  unremarkable.  Repeat CT scan with contrast again showed long segment bowel wall thickening and extensive pericolonic inflammatory stranding involving the sigmoid colon similar to prior study.  Given stability over time it was felt this could represent subacute to inflammatory process such as subacute diverticulitis or inflammatory bowel disease or infiltrative mass.  IV Cipro and IV Flagyl started.  Colonoscopy January 2020: -Diverticulosis in the sigmoid colon and at the hepatic flexure. -Two 5 mm polyps in the descending colon and at the splenic flexure, removed with a cold snare. Resected and retrieved. -External hemorrhoids. -Anal papilla(e) were hypertrophied. -2 tubular adenomas, next colonoscopy in 5 years  Prior to Admission medications   Medication Sig Start Date End Date Taking? Authorizing Provider  acetaminophen (TYLENOL) 500 MG tablet Take 500 mg by mouth every 6 (six) hours as needed (for pain.).   Yes [provider]  ciprofloxacin (CIPRO) 500 MG tablet Take 1 tablet (500 mg total) by mouth 2 (two) times daily. 11/29/21  Yes Mesner, Corene Cornea, MD  ibuprofen (ADVIL,MOTRIN) 200 MG tablet Take 200 mg by mouth every 8 (eight) hours as needed (for pain.).   Yes [provider]  metroNIDAZOLE (FLAGYL) 500 MG tablet Take 1 tablet (500 mg total) by mouth 2 (two) times daily for 14 days. One po bid x 7 days 11/29/21 12/13/21 Yes Mesner, Corene Cornea, MD        Rogene Houston, MD        [name], DO    Current Facility-Administered Medications  Medication Dose Route Frequency Provider Last Rate Last Admin   0.9 %  sodium  chloride infusion   Intravenous Continuous Adefeso, Oladapo, DO       ciprofloxacin (CIPRO) IVPB 400 mg  400 mg Intravenous Q12H Shah, Pratik D, DO       metroNIDAZOLE (FLAGYL) IVPB 500 mg  500 mg Intravenous Q12H Mesner, Jason, MD 100 mL/hr at 11/29/21 0559 500 mg at 11/29/21 0559   morphine (PF) 2 MG/ML injection 2 mg  2 mg Intravenous Q4H PRN Adefeso,  Oladapo, DO       promethazine (PHENERGAN) 12.5 mg in sodium chloride 0.9 % 50 mL IVPB  12.5 mg Intravenous Q6H PRN Mesner, Corene Cornea, MD   Stopped at 11/29/21 0720      Allergies as of 11/28/2021 - Review Complete 11/28/2021  Allergen Reaction Noted   Penicillins Rash 04/03/2018    History reviewed. No pertinent past medical history.  Past Surgical History:  Procedure Laterality Date   ABDOMINAL HYSTERECTOMY     COLONOSCOPY N/A 06/08/2018   Procedure: COLONOSCOPY;  Surgeon: Rogene Houston, MD;  [facility];  Service: Endoscopy;  Laterality: N/A;  7:30   POLYPECTOMY  06/08/2018   Procedure: POLYPECTOMY;  Surgeon: Rogene Houston, MD;  [facility];  Service: Endoscopy;;  cold snare [place] x 1, DC x1    Family History  Problem Relation Age of Onset   Breast cancer Niece     Social History   Socioeconomic History   Marital status: Married    Spouse name: Not on file   Number of children: Not on file   Years of education: Not on file   Highest education level: Not on file  Occupational History   Not on file  Tobacco Use   Smoking status: Never   Smokeless tobacco: Never  Vaping Use   Vaping Use: Never used  Substance and Sexual Activity   Alcohol use: Never   Drug use: Never   Sexual activity: Not Currently    Birth control/protection: None  Other Topics Concern   Not on file  Social History Narrative   Not on file   Social Determinants of Health   Financial Resource Strain: Not on file  Food Insecurity: Not on file  Transportation Needs: Not on file  Physical Activity: Not on file  Stress: Not on file  Social Connections: Not on file  Intimate Partner Violence: Not on file     Review of System:   General: Negative for  weight loss, fever, chills, fatigue, positive weakness.  Poor appetite. Eyes: Negative for vision changes.  ENT: Negative for hoarseness, difficulty swallowing , nasal congestion. CV: Negative for chest pain, angina,  palpitations, dyspnea on exertion, peripheral edema.  Respiratory: Negative for dyspnea at rest, dyspnea on exertion, cough, sputum, wheezing.  GI: See history of present illness. GU:  Negative for dysuria, hematuria, urinary incontinence, urinary frequency, nocturnal urination.  MS: Negative for joint pain, low back pain.  Derm: Negative for rash or itching.  Neuro: Negative for weakness, abnormal sensation, seizure, frequent headaches, memory loss, confusion.  Psych: Negative for anxiety, depression, suicidal ideation, hallucinations.  Endo: Negative for unusual weight change.  Heme: Negative for bruising or bleeding. Allergy: Negative for rash or hives.      Physical Examination:   Vital signs in last 24 hours: Temp:  [97.3 F (36.3 C)-99.5 F (37.5 C)] 99.5 F (37.5 C) (07/12 2139) Pulse Rate:  [71-110] 82 (07/13 0500) Resp:  [12-17] 15 (07/13 0500) BP: (102-141)/(61-81) 127/68 (07/13 0500) SpO2:  [91 %-97 %] 91 % (07/13  0500) Weight:  [73 kg] 73 kg (07/12 2145)    General: Well-nourished, well-developed in no acute distress.  Sleeping upon entering the room.  Easily arousable but drowsy.  Recently receiving promethazine IV. Head: Normocephalic, atraumatic.   Eyes: Conjunctiva pink, no icterus. Mouth: Oropharyngeal mucosa dry. Neck: Supple without thyromegaly, masses, or lymphadenopathy.  Lungs: Clear to auscultation bilaterally.  Heart: Regular rate and rhythm, no murmurs rubs or gallops.  Abdomen: Bowel sounds are normal, nondistended, no hepatosplenomegaly or masses, no abdominal bruits or hernia , no rebound or guarding.  Moderate tenderness to the left lower quadrant with no rebound Rectal: Not performed Extremities: No lower extremity edema, clubbing, deformity.  Neuro: Alert and oriented x 4 , grossly normal neurologically.  Skin: Warm and dry, no rash or jaundice.   Psych: Alert and cooperative, normal mood and affect.        Intake/Output from previous day: 07/12  0701 - 07/13 0700 In: 1200 [IV Piggyback:1200] Out: -  Intake/Output this shift: No intake/output data recorded.  Lab Results:   CBC Recent Labs    11/28/21 1514 11/28/21 2217  WBC 8.9 9.2  HGB 13.8 13.2  HCT 42.8 41.8  MCV 85 87.4  PLT 263 241   BMET Recent Labs    11/28/21 1514 11/28/21 2217  NA 140 135  K 4.2 3.9  CL 102 102  CO2 24 26  GLUCOSE 90 113*  BUN 14 13  CREATININE 0.63 0.73  CALCIUM 9.5 8.9   LFT Recent Labs    11/28/21 1514 11/28/21 2217  BILITOT 0.5 0.9  ALKPHOS 81 69  AST 12 14*  ALT 7 12  PROT 6.8 7.4  ALBUMIN 4.1 4.1    Lipase Recent Labs    11/28/21 2217  LIPASE 26    PT/INR No results for input(s): "LABPROT", "INR" in the last 72 hours.   Hepatitis Panel No results for input(s): "HEPBSAG", "HCVAB", "HEPAIGM", "HEPBIGM" in the last 72 hours.   Imaging Studies:   CT ABDOMEN PELVIS W CONTRAST  Result Date: 11/29/2021 CLINICAL DATA:  Left lower quadrant abdominal pain EXAM: CT ABDOMEN AND PELVIS WITH CONTRAST TECHNIQUE: Multidetector CT imaging of the abdomen and pelvis was performed using the standard protocol following bolus administration of intravenous contrast. RADIATION DOSE REDUCTION: This exam was performed according to the departmental dose-optimization program which includes automated exposure control, adjustment of the mA and/or kV according to patient size and/or use of iterative reconstruction technique. CONTRAST:  178m OMNIPAQUE IOHEXOL 300 MG/ML  SOLN COMPARISON:  11/09/2021 FINDINGS: Lower chest: No acute abnormality. 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 No additional enhancing intrahepatic masses. No intra or extrahepatic biliary ductal dilation. Gallbladder unremarkable. Pancreas: Unremarkable Spleen: Unremarkable Adrenals/Urinary Tract: Adrenal glands are unremarkable. Kidneys are normal, without renal calculi, focal  lesion, or hydronephrosis. Bladder is unremarkable. Stomach/Bowel: There is long segment bowel wall thickening and extensive pericolonic inflammatory stranding involving the sigmoid colon. This appears similar to prior examination. Given its stability over time, this may represent a subacute to inflammatory process, as can be seen with subacute diverticulitis or inflammatory bowel disease, or a infiltrative mass. Mild background sigmoid diverticulosis. No evidence of obstruction or perforation. No free intraperitoneal gas or fluid. No loculated intra-abdominal fluid collections. Stomach, small bowel, and large bowel are otherwise unremarkable. Appendix normal. Vascular/Lymphatic: Mild aortoiliac atherosclerotic calcification. No aortic aneurysm. No pathologic adenopathy within the abdomen and pelvis. Reproductive: Status post  hysterectomy. No adnexal masses. Other: No abdominal wall hernia. Musculoskeletal: Osseous structures are age-appropriate. No acute bone abnormality. IMPRESSION: 1. Long segment bowel wall thickening and extensive pericolonic inflammatory stranding involving the sigmoid colon. This appears similar to prior examination. Given its stability over time, this may represent a subacute to inflammatory process, as can be seen with subacute diverticulitis or inflammatory bowel disease, or a infiltrative mass. No evidence of obstruction or perforation. Correlation with endoscopy may be helpful for further evaluation. 2. 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 Electronically Signed   By: Fidela Salisbury M.D.   On: 11/29/2021 01:09   CT Abdomen Pelvis W Contrast  Result Date: 11/09/2021 CLINICAL DATA:  LEFT lower quadrant abdominal pain with nausea, vomiting, and fever, history of diverticulitis EXAM: CT ABDOMEN AND PELVIS WITH CONTRAST TECHNIQUE: Multidetector CT imaging of the abdomen and pelvis was performed using the standard protocol following bolus administration of intravenous  contrast. RADIATION DOSE REDUCTION: This exam was performed according to the departmental dose-optimization program which includes automated exposure control, adjustment of the mA and/or kV according to patient size and/or use of iterative reconstruction technique. CONTRAST:  179m OMNIPAQUE IOHEXOL 300 MG/ML SOLN IV. Dilute oral contrast. COMPARISON:  04/03/2018 FINDINGS: Lower chest: Lung bases clear Hepatobiliary: Lesion and medial segment LEFT lobe liver 2.0 x 1.6 cm, demonstrating initial peripheral nodular enhancement and fill-in on delayed images consistent with hepatic hemangioma. Additional hemangioma identified posterior RIGHT lobe liver superiorly, 4.2 x 2.1 cm unchanged. Remainder of liver and gallbladder normal appearance. Pancreas: Normal appearance Spleen: Normal appearance Adrenals/Urinary Tract: Adrenal glands normal appearance. Few small peripelvic cysts LEFT kidney; no follow-up imaging recommended. Kidneys, ureters, and bladder otherwise normal appearance Stomach/Bowel: Normal appendix. Diverticulosis of descending and sigmoid colon. Long segment of wall thickening involving the sigmoid colon, approximately 11.4 cm length with pericolic inflammatory changes and few scattered colonic diverticula. This may represent a long segment of diverticulitis or significant colitis. Edema within sigmoid mesocolon. No extraluminal gas. Small fluid collection posterior to the sigmoid colon, 12 x 11 x 10 mm, could represent a tiny inflammatory collection/abscess or a fluid-filled diverticulum. Stomach and remaining bowel loops normal appearance. Vascular/Lymphatic: Atherosclerotic calcification aorta without aneurysm. No adenopathy. Reproductive: Uterus surgically absent with unremarkable ovaries Other: Trace free fluid.  No free air.  No hernia. Musculoskeletal: Osseous demineralization. IMPRESSION: Long segment of wall thickening involving the sigmoid colon, approximately 11.4 cm length, with pericolic  inflammatory changes and few scattered colonic diverticula. This may represent a long segment of acute diverticulitis or significant colitis. Small fluid collection posterior to the sigmoid colon, 12 x 11 x 10 mm, could represent a tiny inflammatory collection/abscess or a fluid-filled diverticulum. Hepatic hemangiomas. Aortic Atherosclerosis (ICD10-I70.0). Electronically Signed   By: MLavonia DanaM.D.   On: 11/09/2021 13:59  [4 week]  Assessment:   71year old female presenting with persistent left lower quadrant pain associated with nausea/vomiting, diagnosed with diverticulitis versus colitis by CT scan June 23, having completed antibiotic therapy with improvement in her symptoms initially.  After off antibiotics for several days she had recurrence of symptoms. Patient presented to the ED due to failing outpatient management.  Abdominal pain: Follow-up CT today with no significant improvement of long segment small bowel wall thickening and pericolonic inflammatory changes.  No notable fluid collections.  Given no significant change in CT findings, would wonder about other etiologies such as inflammatory process or underlying malignancy.  Given fairly recent colonoscopy in January 2020, malignancy felt to be less likely.  Of note, patient has had no significant change in stools, denies blood per rectum or diarrhea.  Plan:   Agree with IV antibiotic therapy.  She has penicillin allergy.   Clear liquid diet. Supportive measures. If no significant improvement over the next 48 hours, she may need to have endoscopic evaluation of the sigmoid colon.    LOS: 0 days   We would like to thank you for the opportunity to participate in the care of Paige Cole.  Laureen Ochs. Bernarda Caffey [facility] [phone] 7/13/20237:45 AM

## 2021-11-29 NOTE — Plan of Care (Signed)
Patient pleasant, able to communicate needs, ambulatory, resting quietlt '@this'$  time with call bell in place.   Problem: Education: Goal: Knowledge of General Education information will improve Description: Including pain rating scale, medication(s)/side effects and non-pharmacologic comfort measures Outcome: Progressing   Problem: Health Behavior/Discharge Planning: Goal: Ability to manage health-related needs will improve Outcome: Progressing   Problem: Clinical Measurements: Goal: Ability to maintain clinical measurements within normal limits will improve Outcome: Progressing Goal: Will remain free from infection Outcome: Progressing Goal: Diagnostic test results will improve Outcome: Progressing Goal: Respiratory complications will improve Outcome: Progressing Goal: Cardiovascular complication will be avoided Outcome: Progressing   Problem: Activity: Goal: Risk for activity intolerance will decrease Outcome: Progressing   Problem: Nutrition: Goal: Adequate nutrition will be maintained Outcome: Progressing   Problem: Coping: Goal: Level of anxiety will decrease Outcome: Progressing   Problem: Elimination: Goal: Will not experience complications related to bowel motility Outcome: Progressing Goal: Will not experience complications related to urinary retention Outcome: Progressing   Problem: Pain Managment: Goal: General experience of comfort will improve Outcome: Progressing   Problem: Safety: Goal: Ability to remain free from injury will improve Outcome: Progressing   Problem: Skin Integrity: Goal: Risk for impaired skin integrity will decrease Outcome: Progressing

## 2021-11-29 NOTE — Progress Notes (Signed)
Pharmacy Antibiotic Note  Paige Cole is a 71 y.o. female admitted on 11/28/2021 with  intra-abdominal infection .  Pharmacy has been consulted for cipro dosing.  Plan: Cipro 400 mg IV every 12 hours. Monitor labs, c/s, and patient improvement.  Height: '5\' 1"'$  (154.9 cm) Weight: 73 kg (160 lb 15 oz) IBW/kg (Calculated) : 47.8  Temp (24hrs), Avg:98.4 F (36.9 C), Min:97.3 F (36.3 C), Max:99.5 F (37.5 C)  Recent Labs  Lab 11/28/21 2217  WBC 9.2  CREATININE 0.73    Estimated Creatinine Clearance: 60.7 mL/min (by C-G formula based on SCr of 0.73 mg/dL).    Allergies  Allergen Reactions   Penicillins Rash    Has patient had a PCN reaction causing immediate rash, facial/tongue/throat swelling, SOB or lightheadedness with hypotension: No Has patient had a PCN reaction causing severe rash involving mucus membranes or skin necrosis: No Has patient had a PCN reaction that required hospitalization: No Has patient had a PCN reaction occurring within the last 10 years: No If all of the above answers are "NO", then may proceed with Cephalosporin use.     Antimicrobials this admission: Cipro 7/13 >> Flagyl 7/13 >>  Microbiology results: 7/13 BCx: pending   Thank you for allowing pharmacy to be a part of this patient's care.  Ramond Craver 11/29/2021 7:38 AM

## 2021-11-29 NOTE — H&P (Signed)
History and Physical    Patient: Paige Cole YNW:[id number] DOB: [birth year] DOA: 11/28/2021 DOS: the patient was seen and examined on 11/29/2021 PCP: [name], DO  Patient coming from: Home  Chief Complaint:  Chief Complaint  Patient presents with   Abdominal Pain   HPI: Paige Cole is a 71 y.o. female with medical history significant of sigmoid diverticulitis who presents to the emergency department due to several days of onset of abdominal pain, nausea and vomiting.  Patient was diagnosed with diverticulitis by her PCP (Dr. Lacinda Axon) and she took antibiotics (Cipro and Flagyl) for about 10 days, she had a transitory improvement in abdominal pain, but the pain returned in left lower quadrant, pain was sharp in nature and was rated as 4-5 on pain scale, it was intermittent and worsens with movement.  It was associated with nausea and vomiting.  She denies fever and chills, chest pain or shortness of breath, there was no sick contact prior to onset of symptoms. She had a colonoscopy done by Dr. Laural Golden on June 08, 2018 s/p treatment for sigmoid diverticulitis  ED Course:  In the emergency department, patient was hemodynamically stable.  Work-up in the ED showed normal CBC and BMP, lipase was 26, urinalysis was normal. CT abdomen and pelvis with contrast showed long segment bowel wall thickening and extensive pericolonic inflammatory stranding involving the sigmoid colon. This appears similar to prior examination. Given its stability over time, this may represent a subacute to inflammatory process, as can be seen with subacute diverticulitis or inflammatory bowel disease, or a infiltrative mass. No evidence of obstruction or perforation. Colonoscopy findings (06/08/2018): Diverticulosis in the sigmoid colon and at the hepatic flexure. - Two 5 mm polyps in the descending colon and at the splenic flexure, removed with a cold snare. Resected and retrieved. - External hemorrhoids. - Anal  papilla(e) were hypertrophied.   Review of Systems: Review of systems as noted in the HPI. All other systems reviewed and are negative.   History reviewed. No pertinent past medical history. Past Surgical History:  Procedure Laterality Date   ABDOMINAL HYSTERECTOMY     COLONOSCOPY N/A 06/08/2018   Procedure: COLONOSCOPY;  Surgeon: Rogene Houston, MD;  Location: [facility];  Service: Endoscopy;  Laterality: N/A;  7:30   POLYPECTOMY  06/08/2018   Procedure: POLYPECTOMY;  Surgeon: Rogene Houston, MD;  Location: [facility];  Service: Endoscopy;;  cold snare [place] x 1, [place] x1    Social History:  reports that she has never smoked. She has never used smokeless tobacco. She reports that she does not drink alcohol and does not use drugs.   Allergies  Allergen Reactions   Penicillins Rash    Has patient had a PCN reaction causing immediate rash, facial/tongue/throat swelling, SOB or lightheadedness with hypotension: No Has patient had a PCN reaction causing severe rash involving mucus membranes or skin necrosis: No Has patient had a PCN reaction that required hospitalization: No Has patient had a PCN reaction occurring within the last 10 years: No If all of the above answers are "NO", then may proceed with Cephalosporin use.     Family History  Problem Relation Age of Onset   Breast cancer Niece      Prior to Admission medications   Medication Sig Start Date End Date Taking? Authorizing Provider  acetaminophen (TYLENOL) 500 MG tablet Take 500 mg by mouth every 6 (six) hours as needed (for pain.).   Yes [provider]  ciprofloxacin (CIPRO)  500 MG tablet Take 1 tablet (500 mg total) by mouth 2 (two) times daily. 11/29/21  Yes Mesner, Corene Cornea, MD  ibuprofen (ADVIL,MOTRIN) 200 MG tablet Take 200 mg by mouth every 8 (eight) hours as needed (for pain.).   Yes [provider]  metroNIDAZOLE (FLAGYL) 500 MG tablet Take 1 tablet (500 mg total) by mouth 2 (two) times daily  for 14 days. One po bid x 7 days 11/29/21 12/13/21 Yes Mesner, Corene Cornea, MD  dicyclomine (BENTYL) 10 MG capsule TAKE 1 CAPSULE BY MOUTH 2 TIMES DAILY AS NEEDED FOR SPASMS. Patient not taking: Reported on 11/29/2021 09/20/19   Rogene Houston, MD  ondansetron (ZOFRAN) 4 MG tablet Take 1 tablet (4 mg total) by mouth every 8 (eight) hours as needed for nausea or vomiting. Patient not taking: Reported on 11/29/2021 11/12/21   [name], DO    Physical Exam: BP 127/68   Pulse 82   Temp 99.5 F (37.5 C) (Oral)   Resp 15   Ht '5\' 1"'$  (1.549 m)   Wt 73 kg   SpO2 91%   BMI 30.41 kg/m   General: 71 y.o. year-old female ill appearing, but in no acute distress.  Alert and oriented x3. HEENT: NCAT, EOMI Neck: Supple, trachea medial Cardiovascular: Regular rate and rhythm with no rubs or gallops.  No thyromegaly or JVD noted.  No lower extremity edema. 2/4 pulses in all 4 extremities. Respiratory: Clear to auscultation with no wheezes or rales. Good inspiratory effort. Abdomen: Soft, tender to palpation in the LLQ.  Normal bowel sounds x4 quadrants. Muskuloskeletal: No cyanosis, clubbing or edema noted bilaterally Neuro: CN II-XII intact, strength 5/5 x 4, sensation, reflexes intact Skin: No ulcerative lesions noted or rashes Psychiatry: Mood is appropriate for condition and setting          Labs on Admission:  Basic Metabolic Panel: Recent Labs  Lab 11/28/21 2217  NA 135  K 3.9  CL 102  CO2 26  GLUCOSE 113*  BUN 13  CREATININE 0.73  CALCIUM 8.9   Liver Function Tests: Recent Labs  Lab 11/28/21 2217  AST 14*  ALT 12  ALKPHOS 69  BILITOT 0.9  PROT 7.4  ALBUMIN 4.1   Recent Labs  Lab 11/28/21 2217  LIPASE 26   No results for input(s): "AMMONIA" in the last 168 hours. CBC: Recent Labs  Lab 11/28/21 2217  WBC 9.2  HGB 13.2  HCT 41.8  MCV 87.4  PLT 241   Cardiac Enzymes: No results for input(s): "CKTOTAL", "CKMB", "CKMBINDEX", "TROPONINI" in the last 168 hours.  BNP  (last 3 results) No results for input(s): "BNP" in the last 8760 hours.  ProBNP (last 3 results) No results for input(s): "PROBNP" in the last 8760 hours.  CBG: No results for input(s): "GLUCAP" in the last 168 hours.  Radiological Exams on Admission: CT ABDOMEN PELVIS W CONTRAST  Result Date: 11/29/2021 CLINICAL DATA:  Left lower quadrant abdominal pain EXAM: CT ABDOMEN AND PELVIS WITH CONTRAST TECHNIQUE: Multidetector CT imaging of the abdomen and pelvis was performed using the standard protocol following bolus administration of intravenous contrast. RADIATION DOSE REDUCTION: This exam was performed according to the departmental dose-optimization program which includes automated exposure control, adjustment of the mA and/or kV according to patient size and/or use of iterative reconstruction technique. CONTRAST:  149m OMNIPAQUE IOHEXOL 300 MG/ML  SOLN COMPARISON:  11/09/2021 FINDINGS: Lower chest: No acute abnormality. 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 No additional enhancing intrahepatic masses. No intra or extrahepatic biliary ductal dilation. Gallbladder unremarkable. Pancreas: Unremarkable Spleen: Unremarkable Adrenals/Urinary Tract: Adrenal glands are unremarkable. Kidneys are normal, without renal calculi, focal lesion, or hydronephrosis. Bladder is unremarkable. Stomach/Bowel: There is long segment bowel wall thickening and extensive pericolonic inflammatory stranding involving the sigmoid colon. This appears similar to prior examination. Given its stability over time, this may represent a subacute to inflammatory process, as can be seen with subacute diverticulitis or inflammatory bowel disease, or a infiltrative mass. Mild background sigmoid diverticulosis. No evidence of obstruction or perforation. No free intraperitoneal gas or fluid. No loculated intra-abdominal fluid collections. Stomach,  small bowel, and large bowel are otherwise unremarkable. Appendix normal. Vascular/Lymphatic: Mild aortoiliac atherosclerotic calcification. No aortic aneurysm. No pathologic adenopathy within the abdomen and pelvis. Reproductive: Status post hysterectomy. No adnexal masses. Other: No abdominal wall hernia. Musculoskeletal: Osseous structures are age-appropriate. No acute bone abnormality. IMPRESSION: 1. Long segment bowel wall thickening and extensive pericolonic inflammatory stranding involving the sigmoid colon. This appears similar to prior examination. Given its stability over time, this may represent a subacute to inflammatory process, as can be seen with subacute diverticulitis or inflammatory bowel disease, or a infiltrative mass. No evidence of obstruction or perforation. Correlation with endoscopy may be helpful for further evaluation. 2. 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 Electronically Signed   By: Fidela Salisbury M.D.   On: 11/29/2021 01:09    EKG: I independently viewed the EKG done and my findings are as followed: EKG was not done in the ED  Assessment/Plan Present on Admission:  Diverticulitis  Left lower quadrant abdominal pain  Principal Problem:   Diverticulitis Active Problems:   Left lower quadrant abdominal pain   Nausea & vomiting   Obesity (BMI 30-39.9)  Acute diverticulitis status post failure of outpatient treatment Abdominal pain, nausea and vomiting in the setting of above Continue IV NS at 75 mLs/Hr Continue continue IV ciprofloxacin and Flagyl (patient is allergic to penicillin) Continue IV morphine 2 mg q.4h p.r.n. for moderate to severe pain Continue IV Phenergan p.r.n. Continue clear liquid diet with plan to advanced diet as tolerated Obtain blood culture x2 Gastroenterology will be consulted to follow up with patient in the morning  Obesity (BMI 30.41 kg/m) Continue diet and lifestyle modification  DVT prophylaxis: Lovenox  Code  Status: Full code  Consults: Gastroenterology  Family Communication: Husband at bedside (all questions answered to satisfaction)  Severity of Illness: The appropriate patient status for this patient is INPATIENT. Inpatient status is judged to be reasonable and necessary in order to provide the required intensity of service to ensure the patient's safety. The patient's presenting symptoms, physical exam findings, and initial radiographic and laboratory data in the context of their chronic comorbidities is felt to place them at high risk for further clinical deterioration. Furthermore, it is not anticipated that the patient will be medically stable for discharge from the hospital within 2 midnights of admission.   * I certify that at the point of admission it is my clinical judgment that the patient will require inpatient hospital care spanning beyond 2 midnights from the point of admission due to high intensity of service, high risk for further deterioration and high frequency of surveillance required.*  Author: Bernadette Hoit, DO 11/29/2021 7:26 AM  For on call review [email or web address].

## 2021-11-29 NOTE — ED Provider Notes (Addendum)
[facility] Provider Note   CSN: [id number] Arrival date & time: 11/28/21  2110     History  Chief Complaint  Patient presents with   Abdominal Pain    Paige Cole is a 71 y.o. female.  71 yo F w/ h/o diverticulitis, recently diagnosed with same in June. Was on antibiotics for 10 days and it started to get better but the last few days pain and nausea came back. No fevers. No urinary symptoms. States it hurts quite a bit with bowel movements, even soft ones.    Abdominal Pain      Home Medications Prior to Admission medications   Medication Sig Start Date End Date Taking? Authorizing Provider  ciprofloxacin (CIPRO) 500 MG tablet Take 1 tablet (500 mg total) by mouth 2 (two) times daily. 11/29/21  Yes Aniela Caniglia, Corene Cornea, MD  metroNIDAZOLE (FLAGYL) 500 MG tablet Take 1 tablet (500 mg total) by mouth 2 (two) times daily for 14 days. One po bid x 7 days 11/29/21 12/13/21 Yes Ruqayya Ventress, Corene Cornea, MD  acetaminophen (TYLENOL) 500 MG tablet Take 500 mg by mouth every 6 (six) hours as needed (for pain.).    [provider]  dicyclomine (BENTYL) 10 MG capsule TAKE 1 CAPSULE BY MOUTH 2 TIMES DAILY AS NEEDED FOR SPASMS. 09/20/19   Rehman, Mechele Dawley, MD  ibuprofen (ADVIL,MOTRIN) 200 MG tablet Take 200 mg by mouth every 8 (eight) hours as needed (for pain.).    [provider]  ondansetron (ZOFRAN) 4 MG tablet Take 1 tablet (4 mg total) by mouth every 8 (eight) hours as needed for nausea or vomiting. 11/12/21   [name], DO      Allergies    Penicillins    Review of Systems   Review of Systems  Gastrointestinal:  Positive for abdominal pain.    Physical Exam Updated Vital Signs BP 127/68   Pulse 82   Temp 99.5 F (37.5 C) (Oral)   Resp 15   Ht '5\' 1"'$  (1.549 m)   Wt 73 kg   SpO2 91%   BMI 30.41 kg/m  Physical Exam Vitals and nursing note reviewed.  Constitutional:      Appearance: She is well-developed.  HENT:     Head: Normocephalic and  atraumatic.  Cardiovascular:     Rate and Rhythm: Normal rate and regular rhythm.  Pulmonary:     Effort: No respiratory distress.     Breath sounds: No stridor.  Abdominal:     General: There is no distension.     Tenderness: There is abdominal tenderness in the left lower quadrant. There is rebound.  Musculoskeletal:     Cervical back: Normal range of motion.  Skin:    General: Skin is warm and dry.  Neurological:     Mental Status: She is alert.     ED Results / Procedures / Treatments   Labs (all labs ordered are listed, but only abnormal results are displayed) Labs Reviewed  COMPREHENSIVE METABOLIC PANEL - Abnormal; Notable for the following components:      Result Value   Glucose, Bld 113 (*)    AST 14 (*)    All other components within normal limits  URINALYSIS, ROUTINE W REFLEX MICROSCOPIC - Abnormal; Notable for the following components:   Specific Gravity, Urine 1.038 (*)    All other components within normal limits  LIPASE, BLOOD  CBC    EKG None  Radiology CT ABDOMEN PELVIS W CONTRAST  Result Date: 11/29/2021 CLINICAL  DATA:  Left lower quadrant abdominal pain EXAM: CT ABDOMEN AND PELVIS WITH CONTRAST TECHNIQUE: Multidetector CT imaging of the abdomen and pelvis was performed using the standard protocol following bolus administration of intravenous contrast. RADIATION DOSE REDUCTION: This exam was performed according to the departmental dose-optimization program which includes automated exposure control, adjustment of the mA and/or kV according to patient size and/or use of iterative reconstruction technique. CONTRAST:  163m OMNIPAQUE IOHEXOL 300 MG/ML  SOLN COMPARISON:  11/09/2021 FINDINGS: Lower chest: No acute abnormality. 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 No additional enhancing intrahepatic masses. No intra or extrahepatic biliary ductal dilation. Gallbladder  unremarkable. Pancreas: Unremarkable Spleen: Unremarkable Adrenals/Urinary Tract: Adrenal glands are unremarkable. Kidneys are normal, without renal calculi, focal lesion, or hydronephrosis. Bladder is unremarkable. Stomach/Bowel: There is long segment bowel wall thickening and extensive pericolonic inflammatory stranding involving the sigmoid colon. This appears similar to prior examination. Given its stability over time, this may represent a subacute to inflammatory process, as can be seen with subacute diverticulitis or inflammatory bowel disease, or a infiltrative mass. Mild background sigmoid diverticulosis. No evidence of obstruction or perforation. No free intraperitoneal gas or fluid. No loculated intra-abdominal fluid collections. Stomach, small bowel, and large bowel are otherwise unremarkable. Appendix normal. Vascular/Lymphatic: Mild aortoiliac atherosclerotic calcification. No aortic aneurysm. No pathologic adenopathy within the abdomen and pelvis. Reproductive: Status post hysterectomy. No adnexal masses. Other: No abdominal wall hernia. Musculoskeletal: Osseous structures are age-appropriate. No acute bone abnormality. IMPRESSION: 1. Long segment bowel wall thickening and extensive pericolonic inflammatory stranding involving the sigmoid colon. This appears similar to prior examination. Given its stability over time, this may represent a subacute to inflammatory process, as can be seen with subacute diverticulitis or inflammatory bowel disease, or a infiltrative mass. No evidence of obstruction or perforation. Correlation with endoscopy may be helpful for further evaluation. 2. 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 Electronically Signed   By: AFidela SalisburyM.D.   On: 11/29/2021 01:09    Procedures Procedures    Medications Ordered in ED Medications  metroNIDAZOLE (FLAGYL) IVPB 500 mg (500 mg Intravenous New Bag/Given 11/29/21 0559)  promethazine (PHENERGAN) 12.5 mg in sodium  chloride 0.9 % 50 mL IVPB (has no administration in time range)  sodium chloride 0.9 % bolus 1,000 mL (has no administration in time range)  fentaNYL (SUBLIMAZE) injection 50 mcg (50 mcg Intravenous Given 11/29/21 0027)  ondansetron (ZOFRAN) injection 4 mg (4 mg Intravenous Given 11/29/21 0027)  lactated ringers bolus 1,000 mL (1,000 mLs Intravenous New Bag/Given 11/29/21 0026)  iohexol (OMNIPAQUE) 300 MG/ML solution 100 mL (100 mLs Intravenous Contrast Given 11/29/21 0034)  ciprofloxacin (CIPRO) IVPB 400 mg (400 mg Intravenous New Bag/Given 11/29/21 0410)  ondansetron (ZOFRAN) injection 4 mg (4 mg Intravenous Given 11/29/21 0426)  ketorolac (TORADOL) 30 MG/ML injection 15 mg (15 mg Intravenous Given 11/29/21 0426)    ED Course/ Medical Decision Making/ A&P                           Medical Decision Making Amount and/or Complexity of Data Reviewed Labs: ordered. Radiology: ordered.  Risk Prescription drug management. Decision regarding hospitalization.   Initially was hoping that patient could be discharged with repeat medications however her emesis is uncontrolled after multiple antiemetics. Abdomen tender. May need colonoscopy to evaluate the area in her colon to ensure no malignancy or IBD. Will d/w  hospitalist for admission.   Final Clinical Impression(s) / ED Diagnoses Final diagnoses:  Diverticulitis    Rx / DC Orders ED Discharge Orders          Ordered    ciprofloxacin (CIPRO) 500 MG tablet  2 times daily        11/29/21 0320    metroNIDAZOLE (FLAGYL) 500 MG tablet  2 times daily        11/29/21 0320              Brinton Brandel, Corene Cornea, MD 11/29/21 5910    Merrily Pew, MD 11/29/21 [phone]

## 2021-11-29 NOTE — Progress Notes (Signed)
  Transition of Care [facility]) Screening Note   Patient Details  Name: Paige Cole Date of Birth: [birth year]   Transition of Care [facility]) CM/SW Contact:    Ihor Gully, LCSW Phone Number: 11/29/2021, 9:28 AM    Transition of Care Department [facility]) has reviewed patient and no TOC needs have been identified at this time. We will continue to monitor patient advancement through interdisciplinary progression rounds. If new patient transition needs arise, please place a TOC consult.

## 2021-11-30 DIAGNOSIS — K5792 Diverticulitis of intestine, part unspecified, without perforation or abscess without bleeding: Secondary | ICD-10-CM | POA: Diagnosis not present

## 2021-11-30 LAB — COMPREHENSIVE METABOLIC PANEL
ALT: 9 U/L (ref 0–44)
AST: 10 U/L — ABNORMAL LOW (ref 15–41)
Albumin: 2.9 g/dL — ABNORMAL LOW (ref 3.5–5.0)
Alkaline Phosphatase: 48 U/L (ref 38–126)
Anion gap: 6 (ref 5–15)
BUN: 6 mg/dL — ABNORMAL LOW (ref 8–23)
CO2: 26 mmol/L (ref 22–32)
Calcium: 8.2 mg/dL — ABNORMAL LOW (ref 8.9–10.3)
Chloride: 106 mmol/L (ref 98–111)
Creatinine, Ser: 0.59 mg/dL (ref 0.44–1.00)
GFR, Estimated: >60 mL/min (ref >60)
Glucose, Bld: 114 mg/dL — ABNORMAL HIGH (ref 70–99)
Potassium: 3.3 mmol/L — ABNORMAL LOW (ref 3.5–5.1)
Sodium: 138 mmol/L (ref 135–145)
Total Bilirubin: 0.6 mg/dL (ref 0.3–1.2)
Total Protein: 5.6 g/dL — ABNORMAL LOW (ref 6.5–8.1)

## 2021-11-30 LAB — CBC
HCT: 34.2 % — ABNORMAL LOW (ref 36.0–46.0)
Hemoglobin: 10.7 g/dL — ABNORMAL LOW (ref 12.0–15.0)
MCH: 27.4 pg (ref 26.0–34.0)
MCHC: 31.3 g/dL (ref 30.0–36.0)
MCV: 87.5 fL (ref 80.0–100.0)
Platelets: 180 10*3/uL (ref 150–400)
RBC: 3.91 MIL/uL (ref 3.87–5.11)
RDW: 14.2 % (ref 11.5–15.5)
WBC: 5.6 10*3/uL (ref 4.0–10.5)
nRBC: 0 % (ref 0.0–0.2)

## 2021-11-30 LAB — APTT: aPTT: 33 seconds (ref 24–36)

## 2021-11-30 MED ORDER — POTASSIUM CHLORIDE CRYS ER 20 MEQ PO TBCR
40.0000 meq | EXTENDED_RELEASE_TABLET | Freq: Once | ORAL | Status: AC
  Administered 2021-11-30: 40 meq via ORAL
  Filled 2021-11-30: qty 2

## 2021-11-30 MED ORDER — METRONIDAZOLE 500 MG PO TABS: 500.0000 mg | ORAL_TABLET | Freq: Two times a day (BID) | ORAL | 0 refills | Status: AC

## 2021-11-30 MED ORDER — CIPROFLOXACIN HCL 500 MG PO TABS: 500.0000 mg | ORAL_TABLET | Freq: Two times a day (BID) | ORAL | 0 refills | Status: AC

## 2021-11-30 NOTE — Progress Notes (Signed)
Pt was alert and able to make needs known . Had no complaints of pain or discomfort. Pt ' s husband was at bedside and ws pt's ride home. AVS printed, discussed with pt. Medication reviewed and pharmacy verified.

## 2021-11-30 NOTE — Progress Notes (Signed)
Gastroenterology Progress Note   Referring Provider: No ref. provider found Primary Care Physician:  [name], DO Primary Gastroenterologist:  Dr.  Patient ID: Paige Cole; [id number]; [birth year]    Subjective   Patient sitting up in bed eating her lunch.  She states she has been tolerating her soft diet today without any additional abdominal pain.  She does report some intermittent discomfort to the left lower quadrant but has been improving.  She denies any nausea or vomiting.  Patient feels well enough to go home.   Objective   Vital signs in last 24 hours Temp:  [97.6 F (36.4 C)-99.1 F (37.3 C)] 97.6 F (36.4 C) (07/14 1254) Pulse Rate:  [63-73] 73 (07/14 1254) Resp:  [16-18] 18 (07/14 1254) BP: (126-153)/(60-75) 153/75 (07/14 1254) SpO2:  [96 %-98 %] 96 % (07/14 1254)    Physical Exam General:   Alert and oriented, pleasant Head:  Normocephalic and atraumatic. Eyes:  No icterus, sclera clear. Conjuctiva pink.  Mouth:  Without lesions, mucosa pink and moist.  Neck:  Supple, without thyromegaly or masses.  Neurologic:  Alert and  oriented x4;  grossly normal neurologically. Skin:  Warm and dry, intact without significant lesions.  Psych:  Alert and cooperative. Normal mood and affect.  Intake/Output from previous day: 07/13 0701 - 07/14 0700 In: 2402.2 [P.O.:520; I.V.:1141.3; IV Piggyback:741] Out: -  Intake/Output this shift: Total I/O In: 340 [P.O.:340] Out: -   Lab Results  Recent Labs    11/28/21 1514 11/28/21 2217 11/30/21 0543  WBC 8.9 9.2 5.6  HGB 13.8 13.2 10.7*  HCT 42.8 41.8 34.2*  PLT 263 241 180   BMET Recent Labs    11/28/21 1514 11/28/21 2217 11/30/21 0543  NA 140 135 138  K 4.2 3.9 3.3*  CL 102 102 106  CO2 '24 26 26  '$ GLUCOSE 90 113* 114*  BUN 14 13 6*  CREATININE 0.63 0.73 0.59  CALCIUM 9.5 8.9 8.2*   LFT Recent Labs    11/28/21 1514 11/28/21 2217 11/30/21 0543  PROT 6.8 7.4 5.6*  ALBUMIN 4.1 4.1 2.9*  AST  12 14* 10*  ALT '7 12 9  '$ ALKPHOS 81 69 48  BILITOT 0.5 0.9 0.6   PT/INR No results for input(s): "LABPROT", "INR" in the last 72 hours. Hepatitis Panel No results for input(s): "HEPBSAG", "HCVAB", "HEPAIGM", "HEPBIGM" in the last 72 hours.  Studies/Results CT ABDOMEN PELVIS W CONTRAST  Result Date: 11/29/2021 CLINICAL DATA:  Left lower quadrant abdominal pain EXAM: CT ABDOMEN AND PELVIS WITH CONTRAST TECHNIQUE: Multidetector CT imaging of the abdomen and pelvis was performed using the standard protocol following bolus administration of intravenous contrast. RADIATION DOSE REDUCTION: This exam was performed according to the departmental dose-optimization program which includes automated exposure control, adjustment of the mA and/or kV according to patient size and/or use of iterative reconstruction technique. CONTRAST:  146m OMNIPAQUE IOHEXOL 300 MG/ML  SOLN COMPARISON:  11/09/2021 FINDINGS: Lower chest: No acute abnormality. 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 No additional enhancing intrahepatic masses. No intra or extrahepatic biliary ductal dilation. Gallbladder unremarkable. Pancreas: Unremarkable Spleen: Unremarkable Adrenals/Urinary Tract: Adrenal glands are unremarkable. Kidneys are normal, without renal calculi, focal lesion, or hydronephrosis. Bladder is unremarkable. Stomach/Bowel: There is long segment bowel wall thickening and extensive pericolonic inflammatory stranding involving the sigmoid colon. This appears similar to prior examination. Given its stability over time, this may represent a subacute  to inflammatory process, as can be seen with subacute diverticulitis or inflammatory bowel disease, or a infiltrative mass. Mild background sigmoid diverticulosis. No evidence of obstruction or perforation. No free intraperitoneal gas or fluid. No loculated intra-abdominal fluid collections.  Stomach, small bowel, and large bowel are otherwise unremarkable. Appendix normal. Vascular/Lymphatic: Mild aortoiliac atherosclerotic calcification. No aortic aneurysm. No pathologic adenopathy within the abdomen and pelvis. Reproductive: Status post hysterectomy. No adnexal masses. Other: No abdominal wall hernia. Musculoskeletal: Osseous structures are age-appropriate. No acute bone abnormality. IMPRESSION: 1. Long segment bowel wall thickening and extensive pericolonic inflammatory stranding involving the sigmoid colon. This appears similar to prior examination. Given its stability over time, this may represent a subacute to inflammatory process, as can be seen with subacute diverticulitis or inflammatory bowel disease, or a infiltrative mass. No evidence of obstruction or perforation. Correlation with endoscopy may be helpful for further evaluation. 2. 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 Electronically Signed   By: Fidela Salisbury M.D.   On: 11/29/2021 01:09   CT Abdomen Pelvis W Contrast  Result Date: 11/09/2021 CLINICAL DATA:  LEFT lower quadrant abdominal pain with nausea, vomiting, and fever, history of diverticulitis EXAM: CT ABDOMEN AND PELVIS WITH CONTRAST TECHNIQUE: Multidetector CT imaging of the abdomen and pelvis was performed using the standard protocol following bolus administration of intravenous contrast. RADIATION DOSE REDUCTION: This exam was performed according to the departmental dose-optimization program which includes automated exposure control, adjustment of the mA and/or kV according to patient size and/or use of iterative reconstruction technique. CONTRAST:  157m OMNIPAQUE IOHEXOL 300 MG/ML SOLN IV. Dilute oral contrast. COMPARISON:  04/03/2018 FINDINGS: Lower chest: Lung bases clear Hepatobiliary: Lesion and medial segment LEFT lobe liver 2.0 x 1.6 cm, demonstrating initial peripheral nodular enhancement and fill-in on delayed images consistent with hepatic  hemangioma. Additional hemangioma identified posterior RIGHT lobe liver superiorly, 4.2 x 2.1 cm unchanged. Remainder of liver and gallbladder normal appearance. Pancreas: Normal appearance Spleen: Normal appearance Adrenals/Urinary Tract: Adrenal glands normal appearance. Few small peripelvic cysts LEFT kidney; no follow-up imaging recommended. Kidneys, ureters, and bladder otherwise normal appearance Stomach/Bowel: Normal appendix. Diverticulosis of descending and sigmoid colon. Long segment of wall thickening involving the sigmoid colon, approximately 11.4 cm length with pericolic inflammatory changes and few scattered colonic diverticula. This may represent a long segment of diverticulitis or significant colitis. Edema within sigmoid mesocolon. No extraluminal gas. Small fluid collection posterior to the sigmoid colon, 12 x 11 x 10 mm, could represent a tiny inflammatory collection/abscess or a fluid-filled diverticulum. Stomach and remaining bowel loops normal appearance. Vascular/Lymphatic: Atherosclerotic calcification aorta without aneurysm. No adenopathy. Reproductive: Uterus surgically absent with unremarkable ovaries Other: Trace free fluid.  No free air.  No hernia. Musculoskeletal: Osseous demineralization. IMPRESSION: Long segment of wall thickening involving the sigmoid colon, approximately 11.4 cm length, with pericolic inflammatory changes and few scattered colonic diverticula. This may represent a long segment of acute diverticulitis or significant colitis. Small fluid collection posterior to the sigmoid colon, 12 x 11 x 10 mm, could represent a tiny inflammatory collection/abscess or a fluid-filled diverticulum. Hepatic hemangiomas. Aortic Atherosclerosis (ICD10-I70.0). Electronically Signed   By: MLavonia DanaM.D.   On: 11/09/2021 13:59    Assessment  71y.o. female with a history of diverticulosis/diverticulitis who presented to the ED with persistent left lower quadrant abdominal pain  associated with nausea/vomiting.  She was diagnosed with diverticulitis versus colitis by CT scan in June.  She initially had improvement of her symptoms with antibiotic  therapy but after she was off antibiotics for several days she had recurrence of symptoms.  She presented to the ED by recommendation of her PCP due to failing outpatient management.   Abdominal pain/diverticulitis: She had follow-up CT yesterday 7/13 with no significant improvement of long segment small bowel wall thickening and pericolonic inflammatory changes.  No notable fluid collections noted.  Given no significant change in CT findings there is concern of inflammatory etiology versus malignancy.  She had colonoscopy performed January 2020 with diverticulosis in the sigmoid colon and at the hepatic flexure, multiple 5 mm polyps found in the descending colon and at the splenic flexure, external hemorrhoids, hypertrophied anal papilla.  Was recommended to have 5-year surveillance.  She has denied any diarrhea, melena, or BRBPR.  She is feeling well today, was able to tolerate regular soft diet without any significant pain.  Of note patient reports she was taking Flagyl and Cipro 250 mg with her prior treatment.  She will be discharged with Cipro 500 mg twice daily and Flagyl 500 mg twice daily for total of 10 days of treatment.  She received Cipro and Flagyl IV while inpatient.  She has Zofran on hand as needed for nausea related to Flagyl.  Patient suspects she does not have true penicillin allergy as she states she is taken amoxicillin in the past with no issue.    Plan / Recommendations  Continue Cipro and Flagyl Advance diet as tolerated Continue supportive measures Follow-up with GI in the next 1 to 2 weeks.  Has appointment on Monday with Silas [place], NP    LOS: 1 day    11/30/2021, 2:03 PM   Venetia Night, MSN, FNP-BC, AGACNP-BC [facility]

## 2021-11-30 NOTE — Discharge Summary (Signed)
Physician Discharge Summary  Paige Cole CNO:[id number] DOB: July 01, 1950 DOA: 11/28/2021  PCP: Paige Spikes, DO  Admit date: 11/28/2021  Discharge date: 11/30/2021  Admitted From:Home  Disposition:  Home  Recommendations for Outpatient Follow-up:  Follow up with PCP in 1-2 weeks Follow-up with GI as scheduled for next Monday Continue ciprofloxacin and Flagyl as prescribed for 9 more days to complete total 10-day course of treatment Continue other home medications as prior  Home Health: None  Equipment/Devices: None  Discharge Condition:Stable  CODE STATUS: Full  Diet recommendation: Heart Healthy  Brief/Interim Summary: Paige Cole is a 71 y.o. female with medical history significant of sigmoid diverticulitis who presents to the emergency department due to several days of onset of abdominal pain, nausea and vomiting.  Patient was diagnosed with diverticulitis by her PCP (Dr. Lacinda Axon) and she took antibiotics (Cipro and Flagyl) for about 10 days, she had a transitory improvement in abdominal pain, but the pain returned in left lower quadrant, pain was sharp in nature and was rated as 4-5 on pain scale, it was intermittent and worsens with movement.  It was associated with nausea and vomiting.  Patient was admitted for acute diverticulitis after failing outpatient treatment.  She was started on IV ciprofloxacin and Flagyl.  She now feels much better and appears stable for discharge and is tolerating diet with no concerns noted.  GI recommending continuation of ciprofloxacin and Flagyl orally for total 9 more days to complete 10-day course of treatment and follow-up with GI outpatient as scheduled next week.  No other acute events noted.  Discharge Diagnoses:  Principal Problem:   Diverticulitis Active Problems:   Left lower quadrant abdominal pain   Nausea & vomiting   Obesity (BMI 30-39.9)  Principal discharge diagnosis: Acute diverticulitis with failed outpatient  treatment.  Discharge Instructions  Discharge Instructions     Diet - low sodium heart healthy   Complete by: As directed    Increase activity slowly   Complete by: As directed       Allergies as of 11/30/2021       Reactions   Penicillins Rash   Has patient had a PCN reaction causing immediate rash, facial/tongue/throat swelling, SOB or lightheadedness with hypotension: No Has patient had a PCN reaction causing severe rash involving mucus membranes or skin necrosis: No Has patient had a PCN reaction that required hospitalization: No Has patient had a PCN reaction occurring within the last 10 years: No If all of the above answers are "NO", then may proceed with Cephalosporin use.        Medication List     TAKE these medications    acetaminophen 500 MG tablet Commonly known as: TYLENOL Take 500 mg by mouth every 6 (six) hours as needed (for pain.).   ciprofloxacin 500 MG tablet Commonly known as: CIPRO Take 1 tablet (500 mg total) by mouth 2 (two) times daily for 9 days. What changed:  how much to take how to take this when to take this additional instructions   dicyclomine 10 MG capsule Commonly known as: BENTYL TAKE 1 CAPSULE BY MOUTH 2 TIMES DAILY AS NEEDED FOR SPASMS.   ibuprofen 200 MG tablet Commonly known as: ADVIL Take 200 mg by mouth every 8 (eight) hours as needed (for pain.).   metroNIDAZOLE 500 MG tablet Commonly known as: Flagyl Take 1 tablet (500 mg total) by mouth 2 (two) times daily for 9 days. One po bid x 7 days What changed:  how much to  take how to take this when to take this additional instructions   ondansetron 4 MG tablet Commonly known as: Zofran Take 1 tablet (4 mg total) by mouth every 8 (eight) hours as needed for nausea or vomiting.        Follow-up Information     Paige Spikes, DO .   Specialty: Family Medicine Why: As needed Contact information: [place] [address] [phone]          Your GI appointment .   Why: as scheduled               Allergies  Allergen Reactions   Penicillins Rash    Has patient had a PCN reaction causing immediate rash, facial/tongue/throat swelling, SOB or lightheadedness with hypotension: No Has patient had a PCN reaction causing severe rash involving mucus membranes or skin necrosis: No Has patient had a PCN reaction that required hospitalization: No Has patient had a PCN reaction occurring within the last 10 years: No If all of the above answers are "NO", then may proceed with Cephalosporin use.     Consultations: GI   Procedures/Studies: CT ABDOMEN PELVIS W CONTRAST  Result Date: 11/29/2021 CLINICAL DATA:  Left lower quadrant abdominal pain EXAM: CT ABDOMEN AND PELVIS WITH CONTRAST TECHNIQUE: Multidetector CT imaging of the abdomen and pelvis was performed using the standard protocol following bolus administration of intravenous contrast. RADIATION DOSE REDUCTION: This exam was performed according to the departmental dose-optimization program which includes automated exposure control, adjustment of the mA and/or kV according to patient size and/or use of iterative reconstruction technique. CONTRAST:  117m OMNIPAQUE IOHEXOL 300 MG/ML  SOLN COMPARISON:  11/09/2021 FINDINGS: Lower chest: No acute abnormality. 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 No additional enhancing intrahepatic masses. No intra or extrahepatic biliary ductal dilation. Gallbladder unremarkable. Pancreas: Unremarkable Spleen: Unremarkable Adrenals/Urinary Tract: Adrenal glands are unremarkable. Kidneys are normal, without renal calculi, focal lesion, or hydronephrosis. Bladder is unremarkable. Stomach/Bowel: There is long segment bowel wall thickening and extensive pericolonic inflammatory stranding involving the sigmoid colon. This appears similar to prior examination.  Given its stability over time, this may represent a subacute to inflammatory process, as can be seen with subacute diverticulitis or inflammatory bowel disease, or a infiltrative mass. Mild background sigmoid diverticulosis. No evidence of obstruction or perforation. No free intraperitoneal gas or fluid. No loculated intra-abdominal fluid collections. Stomach, small bowel, and large bowel are otherwise unremarkable. Appendix normal. Vascular/Lymphatic: Mild aortoiliac atherosclerotic calcification. No aortic aneurysm. No pathologic adenopathy within the abdomen and pelvis. Reproductive: Status post hysterectomy. No adnexal masses. Other: No abdominal wall hernia. Musculoskeletal: Osseous structures are age-appropriate. No acute bone abnormality. IMPRESSION: 1. Long segment bowel wall thickening and extensive pericolonic inflammatory stranding involving the sigmoid colon. This appears similar to prior examination. Given its stability over time, this may represent a subacute to inflammatory process, as can be seen with subacute diverticulitis or inflammatory bowel disease, or a infiltrative mass. No evidence of obstruction or perforation. Correlation with endoscopy may be helpful for further evaluation. 2. 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 Electronically Signed   By: AFidela SalisburyM.D.   On: 11/29/2021 01:09   CT Abdomen Pelvis W Contrast  Result Date: 11/09/2021 CLINICAL DATA:  LEFT lower quadrant abdominal pain with nausea, vomiting, and fever, history of diverticulitis EXAM: CT ABDOMEN AND PELVIS WITH CONTRAST TECHNIQUE: Multidetector CT imaging of the  abdomen and pelvis was performed using the standard protocol following bolus administration of intravenous contrast. RADIATION DOSE REDUCTION: This exam was performed according to the departmental dose-optimization program which includes automated exposure control, adjustment of the mA and/or kV according to patient size and/or use of  iterative reconstruction technique. CONTRAST:  167m OMNIPAQUE IOHEXOL 300 MG/ML SOLN IV. Dilute oral contrast. COMPARISON:  04/03/2018 FINDINGS: Lower chest: Lung bases clear Hepatobiliary: Lesion and medial segment LEFT lobe liver 2.0 x 1.6 cm, demonstrating initial peripheral nodular enhancement and fill-in on delayed images consistent with hepatic hemangioma. Additional hemangioma identified posterior RIGHT lobe liver superiorly, 4.2 x 2.1 cm unchanged. Remainder of liver and gallbladder normal appearance. Pancreas: Normal appearance Spleen: Normal appearance Adrenals/Urinary Tract: Adrenal glands normal appearance. Few small peripelvic cysts LEFT kidney; no follow-up imaging recommended. Kidneys, ureters, and bladder otherwise normal appearance Stomach/Bowel: Normal appendix. Diverticulosis of descending and sigmoid colon. Long segment of wall thickening involving the sigmoid colon, approximately 11.4 cm length with pericolic inflammatory changes and few scattered colonic diverticula. This may represent a long segment of diverticulitis or significant colitis. Edema within sigmoid mesocolon. No extraluminal gas. Small fluid collection posterior to the sigmoid colon, 12 x 11 x 10 mm, could represent a tiny inflammatory collection/abscess or a fluid-filled diverticulum. Stomach and remaining bowel loops normal appearance. Vascular/Lymphatic: Atherosclerotic calcification aorta without aneurysm. No adenopathy. Reproductive: Uterus surgically absent with unremarkable ovaries Other: Trace free fluid.  No free air.  No hernia. Musculoskeletal: Osseous demineralization. IMPRESSION: Long segment of wall thickening involving the sigmoid colon, approximately 11.4 cm length, with pericolic inflammatory changes and few scattered colonic diverticula. This may represent a long segment of acute diverticulitis or significant colitis. Small fluid collection posterior to the sigmoid colon, 12 x 11 x 10 mm, could represent a tiny  inflammatory collection/abscess or a fluid-filled diverticulum. Hepatic hemangiomas. Aortic Atherosclerosis (ICD10-I70.0). Electronically Signed   By: MLavonia DanaM.D.   On: 11/09/2021 13:59     Discharge Exam: Vitals:   11/30/21 0600 11/30/21 1254  BP: 140/60 (!) 153/75  Pulse: 63 73  Resp: 18 18  Temp: 98.6 F (37 C) 97.6 F (36.4 C)  SpO2: 96% 96%   Vitals:   11/29/21 1739 11/29/21 2146 11/30/21 0600 11/30/21 1254  BP: 126/61 (!) 149/64 140/60 (!) 153/75  Pulse: 67 73 63 73  Resp: '17 16 18 18  '$ Temp: 98.5 F (36.9 C) 99.1 F (37.3 C) 98.6 F (37 C) 97.6 F (36.4 C)  TempSrc: Oral Oral Oral Oral  SpO2: 96% 98% 96% 96%  Weight:      Height:        General: Pt is alert, awake, not in acute distress Cardiovascular: RRR, S1/S2 +, no rubs, no gallops Respiratory: CTA bilaterally, no wheezing, no rhonchi Abdominal: Soft, NT, ND, bowel sounds + Extremities: no edema, no cyanosis    The results of significant diagnostics from this hospitalization (including imaging, microbiology, ancillary and laboratory) are listed below for reference.     Microbiology: Recent Results (from the past 240 hour(s))  Culture, blood (Routine X 2) w Reflex to ID Panel     Status: None (Preliminary result)   Collection Time: 11/29/21  7:50 AM   Specimen: Left Antecubital; Blood  Result Value Ref Range Status   Specimen Description LEFT ANTECUBITAL  Final   Special Requests   Final    BOTTLES DRAWN AEROBIC AND ANAEROBIC Blood Culture adequate volume   Culture   Final    NO GROWTH <  24 HOURS Performed at [facility], [address], [place], [place] [address]    Report Status PENDING  Incomplete  Culture, blood (Routine X 2) w Reflex to ID Panel     Status: None (Preliminary result)   Collection Time: 11/29/21  7:57 AM   Specimen: BLOOD LEFT HAND  Result Value Ref Range Status   Specimen Description BLOOD LEFT HAND  Final   Special Requests   Final    BOTTLES DRAWN AEROBIC AND ANAEROBIC  Blood Culture adequate volume   Culture   Final    NO GROWTH < 24 HOURS Performed at [facility], [address], [place], [place] [address]    Report Status PENDING  Incomplete     Labs: BNP (last 3 results) No results for input(s): "BNP" in the last 8760 hours. Basic Metabolic Panel: Recent Labs  Lab 11/28/21 1514 11/28/21 2211 11/28/21 2217 11/30/21 0543  NA 140  --  135 138  K 4.2  --  3.9 3.3*  CL 102  --  102 106  CO2 24  --  26 26  GLUCOSE 90  --  113* 114*  BUN 14  --  13 6*  CREATININE 0.63  --  0.73 0.59  CALCIUM 9.5  --  8.9 8.2*  MG  --  2.1  --   --   PHOS  --  3.0  --   --    Liver Function Tests: Recent Labs  Lab 11/28/21 1514 11/28/21 2217 11/30/21 0543  AST 12 14* 10*  ALT '7 12 9  '$ ALKPHOS 81 69 48  BILITOT 0.5 0.9 0.6  PROT 6.8 7.4 5.6*  ALBUMIN 4.1 4.1 2.9*   Recent Labs  Lab 11/28/21 2217  LIPASE 26   No results for input(s): "AMMONIA" in the last 168 hours. CBC: Recent Labs  Lab 11/28/21 1514 11/28/21 2217 11/30/21 0543  WBC 8.9 9.2 5.6  NEUTROABS 7.0  --   --   HGB 13.8 13.2 10.7*  HCT 42.8 41.8 34.2*  MCV 85 87.4 87.5  PLT 263 241 180   Cardiac Enzymes: No results for input(s): "CKTOTAL", "CKMB", "CKMBINDEX", "TROPONINI" in the last 168 hours. BNP: Invalid input(s): "POCBNP" CBG: No results for input(s): "GLUCAP" in the last 168 hours. D-Dimer No results for input(s): "DDIMER" in the last 72 hours. Hgb A1c No results for input(s): "HGBA1C" in the last 72 hours. Lipid Profile No results for input(s): "CHOL", "HDL", "LDLCALC", "TRIG", "CHOLHDL", "LDLDIRECT" in the last 72 hours. Thyroid function studies No results for input(s): "TSH", "T4TOTAL", "T3FREE", "THYROIDAB" in the last 72 hours.  Invalid input(s): "FREET3" Anemia work up No results for input(s): "VITAMINB12", "FOLATE", "FERRITIN", "TIBC", "IRON", "RETICCTPCT" in the last 72 hours. Urinalysis    Component Value Date/Time   COLORURINE YELLOW 11/29/2021 0252    APPEARANCEUR CLEAR 11/29/2021 0252   LABSPEC 1.038 (H) 11/29/2021 0252   PHURINE 5.0 11/29/2021 0252   GLUCOSEU NEGATIVE 11/29/2021 0252   HGBUR NEGATIVE 11/29/2021 0252   BILIRUBINUR NEGATIVE 11/29/2021 0252   KETONESUR NEGATIVE 11/29/2021 0252   PROTEINUR NEGATIVE 11/29/2021 0252   NITRITE NEGATIVE 11/29/2021 0252   LEUKOCYTESUR NEGATIVE 11/29/2021 0252   Sepsis Labs Recent Labs  Lab 11/28/21 1514 11/28/21 2217 11/30/21 0543  WBC 8.9 9.2 5.6   Microbiology Recent Results (from the past 240 hour(s))  Culture, blood (Routine X 2) w Reflex to ID Panel     Status: None (Preliminary result)   Collection Time: 11/29/21  7:50 AM  Specimen: Left Antecubital; Blood  Result Value Ref Range Status   Specimen Description LEFT ANTECUBITAL  Final   Special Requests   Final    BOTTLES DRAWN AEROBIC AND ANAEROBIC Blood Culture adequate volume   Culture   Final    NO GROWTH < 24 HOURS Performed at [facility], [address] [place], [place] [address]    Report Status PENDING  Incomplete  Culture, blood (Routine X 2) w Reflex to ID Panel     Status: None (Preliminary result)   Collection Time: 11/29/21  7:57 AM   Specimen: BLOOD LEFT HAND  Result Value Ref Range Status   Specimen Description BLOOD LEFT HAND  Final   Special Requests   Final    BOTTLES DRAWN AEROBIC AND ANAEROBIC Blood Culture adequate volume   Culture   Final    NO GROWTH < 24 HOURS Performed at [facility], [address] [place], [place] [address]    Report Status PENDING  Incomplete     Time coordinating discharge: 35 minutes  SIGNED:   Rodena Goldmann, DO [facility] 11/30/2021, 1:35 PM  If 7PM-7AM, please contact night-coverage [email or web address]

## 2021-12-03 ENCOUNTER — Other Ambulatory Visit (INDEPENDENT_AMBULATORY_CARE_PROVIDER_SITE_OTHER): Payer: Self-pay

## 2021-12-03 ENCOUNTER — Encounter (INDEPENDENT_AMBULATORY_CARE_PROVIDER_SITE_OTHER): Payer: Self-pay | Admitting: Gastroenterology

## 2021-12-03 ENCOUNTER — Telehealth (INDEPENDENT_AMBULATORY_CARE_PROVIDER_SITE_OTHER): Payer: Self-pay

## 2021-12-03 ENCOUNTER — Other Ambulatory Visit: Payer: Self-pay | Admitting: *Deleted

## 2021-12-03 ENCOUNTER — Ambulatory Visit (INDEPENDENT_AMBULATORY_CARE_PROVIDER_SITE_OTHER): Payer: Medicare HMO | Admitting: Gastroenterology

## 2021-12-03 ENCOUNTER — Encounter (INDEPENDENT_AMBULATORY_CARE_PROVIDER_SITE_OTHER): Payer: Self-pay

## 2021-12-03 VITALS — BP 158/81 | HR 62 | Temp 97.6°F | Ht 61.0 in | Wt 158.8 lb

## 2021-12-03 DIAGNOSIS — K5792 Diverticulitis of intestine, part unspecified, without perforation or abscess without bleeding: Secondary | ICD-10-CM

## 2021-12-03 DIAGNOSIS — R110 Nausea: Secondary | ICD-10-CM | POA: Diagnosis not present

## 2021-12-03 MED ORDER — NA SULFATE-K SULFATE-MG SULF 17.5-3.13-1.6 GM/177ML PO SOLN: 1.0000 | Freq: Once | ORAL | 0 refills | Status: AC

## 2021-12-03 NOTE — Telephone Encounter (Signed)
Hassell Patras Ann Kent Riendeau, CMA  ?

## 2021-12-03 NOTE — Patient Instructions (Addendum)
It was nice to meet you! Please continue with blander/softer diet for the next week or so, then you can start adding in higher fiber foods as you are able. Please start miralax tonight if you do not have a BM today, start with 1 capful per day, please let me know if you have not had a BM by Wednesday. You can start probiotic as well, once daily, take this atleast 2 hours after or 4 hours before antibiotic doses to ensure no interaction.  Please restart your antibiotics and take with food, try taking your zofran about 30-60 minutes before you take these, if you have recurrence of palpitations, lightheadedness or feeling odd with restarting these, please let me know.   We will get you scheduled for colonoscopy to ensure healing and no underlying contributing factors to your symptoms.  If you would like to try an anti spasmodic as we discussed for abdominal cramping, ongoing twinges, please let me know.  Follow up 3 months

## 2021-12-03 NOTE — Progress Notes (Signed)
Referring Provider: [name], DO Primary Care Physician:  [name], DO Primary GI Physician: Previously Rehman  Chief Complaint  Patient presents with   Diverticulitis    Went to ED and was admitted. Discharged on 7/14.  Diagnosis with diverticulitis. Patient does feel better. Taking flagyl and cipro. She took med yesterday and got nausea and took zofran. Felt better, then got real shaky and heart was pounding. Did not take evening dose and did not take morning dose. Having some pain on left lower side. States not really pain more like a twinge. Nausea and vomiting are better.    HPI:   Paige Cole is a 71 y.o. female with past medical history of diverticulosis/diverticulitis.   Patient presenting today for hospital follow up of diverticulitis.  Last seen in office 01/2020.  History: Patient seen by PCP on 11/09/21 with c/o severe lower abd pain with tenderness of lower abdomen, nausea and vomiting, sent for labs and CT. Labs unremarkable, CT as below. Prescribed course of Cipro and flagyl, notably, PCP spoke with Radiologist who felt abscess was not large enough for drainage at that time.   CT A/P with contrast done 11/09/21 with Long segment of wall thickening involving the sigmoid colon, approximately 11.4 cm length, with pericolic inflammatory changes and few scattered colonic diverticula, query acute diverticulitis vs significant colitis, Small fluid collection posterior to the sigmoid colon, 12 x 11 x 10 mm, could represent a tiny inflammatory collection/abscess or a fluid-filled diverticulum. Hepatic hemangiomas.  Seen again by PCP on 6/26 with some improvement in abdominal pain and vomiting, nausea still lingering, plans to finish abx and Rx zofran given.   Saw PCP on 7/12 with continued LLQ pain, labs and C diff testing ordered though C diff testing not completed, CBC and CMP unremarkable.   Patient seen in ED on 7/13 for recurrence of abdominal pain and nausea, GI  consulted, repeat CT without much change from prior, patient started on IV abx and clear liquids, consideration for flex sig if no improvement over the nxt 48 hours. Discharged on 7/14 with repeat course of cipro and flagyl x10 days   Present: She reports that she started having flu like symptoms on Thursday after seeing PCP for the first time on Tuesday with fatigue, headache, fevers and nausea, she was unable to see PCP on Thursday so she went to [facility] and had flu testing that was negative. Given previous diverticulitis hx, she states this did not present the same. On Sunday she woke up with extreme abdominal pain that resolved with BM, she then went back to PCP who ordered CT and started flagyl and cipro, still did not feel better after a few days so she saw NP at PCP who ordered labs and stool sample. She states she felt much worse that evening and that is when she went to the ED. She states during acute episode she was having more regular BMs, with softer stools, a little smaller in volume.  First episode of diverticulitis was in 2019, she is unsure of last episode. She has had a few since then. She does report that she sometimes feels these symptoms and will go to a liquid diet and symptoms will resolve.   She was d/c Friday evening from hospital, she does endorse nausea Sunday morning after taking her cipro and flagyl, used zofran which helped. She felt somewhat shaky and having palpitations later that day that lasted approximately 3-4 hours, she did not take her last  2 doses of antibiotics given this. She continues to have occasional twinge of pain in LLQ. She notes that she tolerated antibiotics previously other than nausea. She is still doing bland/soft diet. She denies any rectal bleeding or diarrhea, last BM was on Wednesday. She does endorse having a lot of gas, has not really had the urge to have a BM yet.   Only takes NSAIDs on occasion.   Last imaging: CT A/P 11/29/21: Long segment bowel wall  thickening and extensive pericolonic inflammatory stranding involving the sigmoid colon. This appears similar to prior examination. Given its stability over time, this may represent a subacute to inflammatory process, as can be seen with subacute diverticulitis or inflammatory bowel disease, or a infiltrative mass. No evidence of obstruction or perforation. Correlation with endoscopy may be helpful for further evaluation. 2. 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 Last Colonoscopy:06/08/18 - Diverticulosis in the sigmoid colon and at the hepatic flexure. - Two 5 mm polyps in the descending colon and at the splenic flexure, removed with a cold snare. Resected and retrieved. - External hemorrhoids. - Anal papilla(e) were hypertrophied. Last Endoscopy:never  Recommendations:    No past medical history on file.  Past Surgical History:  Procedure Laterality Date   ABDOMINAL HYSTERECTOMY     COLONOSCOPY N/A 06/08/2018   Procedure: COLONOSCOPY;  Surgeon: Rogene Houston, MD;  [facility];  Service: Endoscopy;  Laterality: N/A;  7:30   POLYPECTOMY  06/08/2018   Procedure: POLYPECTOMY;  Surgeon: Rogene Houston, MD;  Location: [facility];  Service: Endoscopy;;  cold snare [address] x 1, DC x1    Current Outpatient Medications  Medication Sig Dispense Refill   acetaminophen (TYLENOL) 500 MG tablet Take 500 mg by mouth every 6 (six) hours as needed (for pain.).     ciprofloxacin (CIPRO) 500 MG tablet Take 1 tablet (500 mg total) by mouth 2 (two) times daily for 9 days. 18 tablet 0   dicyclomine (BENTYL) 10 MG capsule TAKE 1 CAPSULE BY MOUTH 2 TIMES DAILY AS NEEDED FOR SPASMS. 60 capsule 3   ibuprofen (ADVIL,MOTRIN) 200 MG tablet Take 200 mg by mouth every 8 (eight) hours as needed (for pain.).     metroNIDAZOLE (FLAGYL) 500 MG tablet Take 1 tablet (500 mg total) by mouth 2 (two) times daily for 9 days. One po bid x 7 days 18 tablet 0   ondansetron (ZOFRAN) 4 MG tablet  Take 1 tablet (4 mg total) by mouth every 8 (eight) hours as needed for nausea or vomiting. 30 tablet 3   No current facility-administered medications for this visit.    Allergies as of 12/03/2021 - Review Complete 12/03/2021  Allergen Reaction Noted   Penicillins Rash 04/03/2018    Family History  Problem Relation Age of Onset   Breast cancer Niece     Social History   Socioeconomic History   Marital status: Married    Spouse name: Not on file   Number of children: Not on file   Years of education: Not on file   Highest education level: Not on file  Occupational History   Not on file  Tobacco Use   Smoking status: Never   Smokeless tobacco: Never  Vaping Use   Vaping Use: Never used  Substance and Sexual Activity   Alcohol use: Never   Drug use: Never   Sexual activity: Not Currently    Birth control/protection: None  Other Topics Concern   Not on file  Social  History Narrative   Not on file   Social Determinants of Health   Financial Resource Strain: Not on file  Food Insecurity: Not on file  Transportation Needs: Not on file  Physical Activity: Not on file  Stress: Not on file  Social Connections: Not on file   Review of systems General: negative for malaise, night sweats, fever, chills, weight loss Neck: Negative for lumps, goiter, pain and significant neck swelling Resp: Negative for cough, wheezing, dyspnea at rest CV: Negative for chest pain, leg swelling, palpitations, orthopnea GI: denies melena, hematochezia, vomiting, diarrhea, constipation, dysphagia, odyonophagia, early satiety or unintentional weight loss. +nausea +flatulence +occasional LLQ "twinge" MSK: Negative for joint pain or swelling, back pain, and muscle pain. Derm: Negative for itching or rash Psych: Denies depression, anxiety, memory loss, confusion. No homicidal or suicidal ideation.  Heme: Negative for prolonged bleeding, bruising easily, and swollen nodes. Endocrine: Negative for  cold or heat intolerance, polyuria, polydipsia and goiter. Neuro: negative for tremor, gait imbalance, syncope and seizures. The remainder of the review of systems is noncontributory.  Physical Exam: There were no vitals taken for this visit. General:   Alert and oriented. No distress noted. Pleasant and cooperative.  Head:  Normocephalic and atraumatic. Eyes:  Conjuctiva clear without scleral icterus. Mouth:  Oral mucosa pink and moist. Good dentition. No lesions. Heart: Normal rate and rhythm, s1 and s2 heart sounds present.  Lungs: Clear lung sounds in all lobes. Respirations equal and unlabored. Abdomen:  +BS, soft, and non-distended. Very little to almost no tenderness to palpation of abdomen. No rebound or guarding. No HSM or masses noted. Derm: No palmar erythema or jaundice Msk:  Symmetrical without gross deformities. Normal posture. Extremities:  Without edema. Neurologic:  Alert and  oriented x4 Psych:  Alert and cooperative. Normal mood and affect.  Invalid input(s): "6 MONTHS"   ASSESSMENT: Paige Cole is a 71 y.o. female presenting today for hospital follow up of diverticulitis.  Diagnosed with diverticulitis on 11/09/21 after presenting with fever,abd pain and nausea, with CT with presence of Long segment of wall thickening involving the sigmoid colon, approximately 11.4 cm length, with pericolic inflammatory changes and few scattered colonic diverticula, and Small fluid collection posterior to the sigmoid colon, 12 x 11 x 10 mm. Treated with cipro and flagyl without much improvement. Hospital admission on 7/12 with continued symptoms and CT without much change other than absence of previously noted fluid pocket. Advised to complete second round of cipro and flagyl x10 days.  Patient feeling much better, with only occasional "twinge" of discomfort in LLQ. Continues with some nausea, which seems to be brought on more by antibiotics, notably with an episode of palpitations and  some lightheadedness after abx dose on Sunday, has not taken last two doses. Previously tolerated Cipro and flagyl well with only nausea. Encouraged to take her dose today, she will make me aware if she has further episodes of palpitations or feeling faint. Can try scheduled zofran dosing 30-60 minutes prior to abx doses to help counteract nausea. She should continue soft bland diet for the next week or so and can slowly reintroduce higher fiber foods as tolerated with ultimate goal of high fiber diet regularly after resolution of acute diverticulitis. She can also start daily probiotic given multiple recent antibiotics, should take 4 hours before or 2 hours after her antibiotics to prevent interaction.   I discussed with patient etiology of diverticulitis and that NSAIDs should be avoided in acute episodes and should be  used very infrequently thereafter, recommended tylenol PRN for pain and high fiber diet to help prevent further episodes of diverticulitis. Also discussed that most up to date studies do not show correlation with diverticulitis and nuts/seeds, can eat these as tolerated once symptoms are completely resolved. Discussed indications of colonoscopy after acute diverticulitis as in rare cases malignancy can masquerade as diverticulitis. Indications, risks and benefits of procedure discussed in detail with patient. Patient verbalized understanding and is in agreement to proceed with Colonoscopy at this time.   No BM since Wednesday, can start miralax this evening with 1 capful daily, if no BM by Wednesday, she should let me know. She is passing flatus.   Suspect some underlying IBS as she tells me she has occasional abdominal cramping that will sometimes elicit diarrhea and or vomiting and has experienced this for the past few years. Unsure of specific food triggers. Can consider anti spasmodic to help with symptom control and should watch diet for specific triggers.    PLAN:  Continue abx  2.  Bland/soft diet for now, can slowly transition to higher fiber 3. Probiotic daily 4. Schedule Colonoscopy after aug 4th 5. Try taking zofran on more scheduled doses about 30-60 mins prior to abx 6. Start miralax 1 capful daily, pt to make me aware if no BM by wednesday 7. Consider anti spasmodics  All questions were answered, patient verbalized understanding and is in agreement with plan as outlined above.   Follow Up: 3 months   Ascencion Stegner L. Alver Sorrow, MSN, APRN, AGNP-C Adult-Gerontology Nurse Practitioner [facility]

## 2021-12-03 NOTE — Patient Outreach (Signed)
  Care Coordination [facility] Note Transition Care Management Follow-up Telephone Call Date of discharge and from where: 11/30/21 [facility] How have you been since you were released from the hospital? Patient states she is doing "pretty good".  Any questions or concerns? Yes, having some difficulty taking all of the antibiotics that were prescribed. She will discuss this today during her follow up appointment with GI.  Items Reviewed: Did the pt receive and understand the discharge instructions provided? Yes  Medications obtained and verified? Yes  Other? No  Any new allergies since your discharge? No  Dietary orders reviewed? Yes Do you have support at home? Yes   Home Care and Equipment/Supplies: Were home health services ordered? no If so, what is the name of the agency? N/A  Has the agency set up a time to come to the patient's home? not applicable Were any new equipment or medical supplies ordered?  No What is the name of the medical supply agency? N/A Were you able to get the supplies/equipment? not applicable Do you have any questions related to the use of the equipment or supplies? No  Functional Questionnaire: (I = Independent and D = Dependent) ADLs: I  Bathing/Dressing- I  Meal Prep- I  Eating- I  Maintaining continence- I  Transferring/Ambulation- I  Managing Meds- I  Follow up appointments reviewed:  PCP Hospital f/u appt confirmed? No   Specialist Hospital f/u appt confirmed? Yes  Scheduled to see  Carlan, NP with GI on 12/03/21 @ 1015. Are transportation arrangements needed? No  If their condition worsens, is the pt aware to call PCP or go to the Emergency Dept.? Yes Was the patient provided with contact information for the PCP's office or ED? Yes Was to pt encouraged to call back with questions or concerns? Yes  SDOH assessments and interventions completed:   No  Care Coordination Interventions Activated:  No Care Coordination Interventions:    N/A  Encounter Outcome:  Pt. Visit Completed  Emelia Loron RN, BSN [place] [phone] [email or web address]

## 2021-12-04 LAB — CULTURE, BLOOD (ROUTINE X 2)
Culture: NO GROWTH
Culture: NO GROWTH
Special Requests: ADEQUATE
Special Requests: ADEQUATE

## 2021-12-10 ENCOUNTER — Encounter (INDEPENDENT_AMBULATORY_CARE_PROVIDER_SITE_OTHER): Payer: Self-pay

## 2021-12-10 NOTE — Telephone Encounter (Signed)
Called and discussed with patient per [facility] - She needed a total of 10 days of treatment to include the IV doses in the hospital so she should be good to stop these if she already took 9 days of the pills at home.  Patient verbalized understanding.

## 2021-12-10 NOTE — Telephone Encounter (Signed)
See note below. Pt states she is feeling good. She states the flagyl makes her feel worse. Did not take today and feels good. She has 11 more flagyl tablets left and 9 more cipro. States she has diarrhea that she thinks is from taking the meds and cant sleep when taking flagyl.

## 2022-01-16 ENCOUNTER — Ambulatory Visit (HOSPITAL_BASED_OUTPATIENT_CLINIC_OR_DEPARTMENT_OTHER): Payer: Medicare HMO | Admitting: Anesthesiology

## 2022-01-16 ENCOUNTER — Encounter (HOSPITAL_COMMUNITY)
Admission: RE | Disposition: A | Discharge status: Home / Self Care | Payer: Self-pay | Source: Home / Self Care | Attending: Gastroenterology

## 2022-01-16 ENCOUNTER — Encounter (HOSPITAL_COMMUNITY): Payer: Self-pay | Admitting: Gastroenterology

## 2022-01-16 ENCOUNTER — Ambulatory Visit (HOSPITAL_COMMUNITY)
Admission: RE | Admit: 2022-01-16 | Discharge: 2022-01-16 | Disposition: A | Discharge status: Home / Self Care | Payer: Medicare HMO | Attending: Gastroenterology | Admitting: Gastroenterology

## 2022-01-16 ENCOUNTER — Other Ambulatory Visit: Payer: Self-pay

## 2022-01-16 ENCOUNTER — Ambulatory Visit (HOSPITAL_COMMUNITY): Payer: Medicare HMO | Admitting: Anesthesiology

## 2022-01-16 ENCOUNTER — Encounter (INDEPENDENT_AMBULATORY_CARE_PROVIDER_SITE_OTHER): Payer: Self-pay | Admitting: *Deleted

## 2022-01-16 DIAGNOSIS — K5730 Diverticulosis of large intestine without perforation or abscess without bleeding: Secondary | ICD-10-CM | POA: Diagnosis not present

## 2022-01-16 DIAGNOSIS — K635 Polyp of colon: Secondary | ICD-10-CM

## 2022-01-16 DIAGNOSIS — D123 Benign neoplasm of transverse colon: Secondary | ICD-10-CM | POA: Insufficient documentation

## 2022-01-16 DIAGNOSIS — K5790 Diverticulosis of intestine, part unspecified, without perforation or abscess without bleeding: Secondary | ICD-10-CM

## 2022-01-16 DIAGNOSIS — Z09 Encounter for follow-up examination after completed treatment for conditions other than malignant neoplasm: Secondary | ICD-10-CM | POA: Insufficient documentation

## 2022-01-16 DIAGNOSIS — Z8719 Personal history of other diseases of the digestive system: Secondary | ICD-10-CM | POA: Insufficient documentation

## 2022-01-16 DIAGNOSIS — D125 Benign neoplasm of sigmoid colon: Secondary | ICD-10-CM | POA: Insufficient documentation

## 2022-01-16 DIAGNOSIS — K5732 Diverticulitis of large intestine without perforation or abscess without bleeding: Secondary | ICD-10-CM

## 2022-01-16 HISTORY — PX: POLYPECTOMY: SHX5525

## 2022-01-16 HISTORY — PX: COLONOSCOPY WITH PROPOFOL: SHX5780

## 2022-01-16 LAB — HM COLONOSCOPY

## 2022-01-16 SURGERY — COLONOSCOPY WITH PROPOFOL
Anesthesia: General

## 2022-01-16 MED ORDER — PROPOFOL 500 MG/50ML IV EMUL
INTRAVENOUS | Status: DC | PRN
  Administered 2022-01-16: 200 ug/kg/min via INTRAVENOUS

## 2022-01-16 MED ORDER — PROPOFOL 10 MG/ML IV BOLUS
INTRAVENOUS | Status: DC | PRN
  Administered 2022-01-16: 80 mg via INTRAVENOUS

## 2022-01-16 MED ORDER — LIDOCAINE 2% (20 MG/ML) 5 ML SYRINGE
INTRAMUSCULAR | Status: DC | PRN
  Administered 2022-01-16: 50 mg via INTRAVENOUS

## 2022-01-16 MED ORDER — LACTATED RINGERS IV SOLN: INTRAVENOUS | Status: DC | PRN

## 2022-01-16 NOTE — Progress Notes (Signed)
Post procedure pt c/o lower abdominal pressure, rated 2/10.  Assisted pt to bathroom, pt stated she passed a lot of "air" stating "I feel a lot better now."  Denies pain stating lower abdomen "just uncomfortable now."  Advised to call MD or return to ED for increasing unrelieved abdominal pain.  Pt verbalized understanding.

## 2022-01-16 NOTE — Transfer of Care (Signed)
Immediate Anesthesia Transfer of Care Note  Patient: Paige Cole  Procedure(s) Performed: COLONOSCOPY WITH PROPOFOL POLYPECTOMY  Patient Location: Endoscopy Unit  Anesthesia Type:MAC  Level of Consciousness: awake, alert , oriented and patient cooperative  Airway & Oxygen Therapy: Patient Spontanous Breathing  Post-op Assessment: Report given to RN and Post -op Vital signs reviewed and stable  Post vital signs: Reviewed and stable  Last Vitals:  Vitals Value Taken Time  BP    Temp    Pulse    Resp    SpO2      Last Pain:  Vitals:   01/16/22 0735  TempSrc:   PainSc: 0-No pain      Patients Stated Pain Goal: 9 (35/57/32 2025)  Complications: No notable events documented.

## 2022-01-16 NOTE — Discharge Instructions (Addendum)
You are being discharged to home.  Resume your previous diet.  We are waiting for your pathology results.  Your physician has recommended a repeat colonoscopy for surveillance based on pathology results.  Do not take any  BC/Goody powders, ibuprofen (including Advil, Motrin or Nuprin), naproxen, or other non-steroidal anti-inflammatory drugs.

## 2022-01-16 NOTE — H&P (Signed)
Paige Cole is an 71 y.o. female.   Chief Complaint: History of diverticulitis HPI: 71 year old female with past medical history of diverticulitis, who comes for follow-up after episode of diverticulitis.  Patient states that after her episode of diverticulitis she has felt well.  Has only felt mild discomfort yesterday in her left lower quadrant but denies any nausea, vomiting, fever, chills, abdominal distention, melena, hematochezia.  History reviewed. No pertinent past medical history.  Past Surgical History:  Procedure Laterality Date   ABDOMINAL HYSTERECTOMY     COLONOSCOPY N/A 06/08/2018   Procedure: COLONOSCOPY;  Surgeon: Rogene Houston, MD;  [facility];  Service: Endoscopy;  Laterality: N/A;  7:30   POLYPECTOMY  06/08/2018   Procedure: POLYPECTOMY;  Surgeon: Rogene Houston, MD;  [facility];  Service: Endoscopy;;  cold snare [place] x 1, DC x1    Family History  Problem Relation Age of Onset   Breast cancer Niece    Social History:  reports that she has never smoked. She has been exposed to tobacco smoke. She has never used smokeless tobacco. She reports that she does not drink alcohol and does not use drugs.  Allergies:  Allergies  Allergen Reactions   Penicillins Rash    Has patient had a PCN reaction causing immediate rash, facial/tongue/throat swelling, SOB or lightheadedness with hypotension: No Has patient had a PCN reaction causing severe rash involving mucus membranes or skin necrosis: No Has patient had a PCN reaction that required hospitalization: No Has patient had a PCN reaction occurring within the last 10 years: No If all of the above answers are "NO", then may proceed with Cephalosporin use.     Medications Prior to Admission  Medication Sig Dispense Refill   dicyclomine (BENTYL) 10 MG capsule TAKE 1 CAPSULE BY MOUTH 2 TIMES DAILY AS NEEDED FOR SPASMS. 60 capsule 3   ibuprofen (ADVIL,MOTRIN) 200 MG tablet Take 200 mg by mouth every  8 (eight) hours as needed (for pain.).     aspirin-acetaminophen-caffeine (EXCEDRIN MIGRAINE) 250-250-65 MG tablet Take 1 tablet by mouth every 6 (six) hours as needed for headache.     ondansetron (ZOFRAN) 4 MG tablet Take 1 tablet (4 mg total) by mouth every 8 (eight) hours as needed for nausea or vomiting. (Patient not taking: Reported on 01/14/2022) 30 tablet 3    No results found for this or any previous visit (from the past 48 hour(s)). No results found.  Review of Systems  All other systems reviewed and are negative.   Blood pressure (!) 163/86, pulse 93, temperature 98.3 F (36.8 C), temperature source Oral, resp. rate 10, height '5\' 1"'$  (1.549 m), weight 72.6 kg, SpO2 98 %. Physical Exam  GENERAL: The patient is AO x3, in no acute distress. HEENT: Head is normocephalic and atraumatic. EOMI are intact. Mouth is well hydrated and without lesions. NECK: Supple. No masses LUNGS: Clear to auscultation. No presence of rhonchi/wheezing/rales. Adequate chest expansion HEART: RRR, normal s1 and s2. ABDOMEN: Soft, nontender, no guarding, no peritoneal signs, and nondistended. BS +. No masses. EXTREMITIES: Without any cyanosis, clubbing, rash, lesions or edema. NEUROLOGIC: AOx3, no focal motor deficit. SKIN: no jaundice, no rashes  Assessment/Plan 71 year old female with past medical history of diverticulitis, who comes for follow-up after episode of diverticulitis.  We will proceed with colonoscopy.  Harvel Quale, MD 01/16/2022, 7:32 AM

## 2022-01-16 NOTE — Anesthesia Preprocedure Evaluation (Signed)
Anesthesia Evaluation  Patient identified by MRN, date of birth, ID band Patient awake    Reviewed: Allergy & Precautions, H&P , NPO status , Patient's Chart, lab work & pertinent test results, reviewed documented beta blocker date and time   Airway Mallampati: II  TM Distance: >3 FB Neck ROM: full    Dental no notable dental hx.    Pulmonary neg pulmonary ROS,    Pulmonary exam normal breath sounds clear to auscultation       Cardiovascular Exercise Tolerance: Good negative cardio ROS   Rhythm:regular Rate:Normal     Neuro/Psych negative neurological ROS  negative psych ROS   GI/Hepatic negative GI ROS, Neg liver ROS,   Endo/Other  negative endocrine ROS  Renal/GU negative Renal ROS  negative genitourinary   Musculoskeletal   Abdominal   Peds  Hematology negative hematology ROS (+)   Anesthesia Other Findings   Reproductive/Obstetrics negative OB ROS                             Anesthesia Physical Anesthesia Plan  ASA: 2  Anesthesia Plan: General   Post-op Pain Management:    Induction:   PONV Risk Score and Plan: Propofol infusion  Airway Management Planned:   Additional Equipment:   Intra-op Plan:   Post-operative Plan:   Informed Consent: I have reviewed the patients History and Physical, chart, labs and discussed the procedure including the risks, benefits and alternatives for the proposed anesthesia with the patient or authorized representative who has indicated his/her understanding and acceptance.     Dental Advisory Given  Plan Discussed with: CRNA  Anesthesia Plan Comments:         Anesthesia Quick Evaluation

## 2022-01-16 NOTE — Op Note (Signed)
[facility] Patient Name: Paige Cole Procedure Date: 01/16/2022 7:20 AM MRN: [id number] Date of Birth: Mar 26, 1951 Attending MD: Maylon Peppers ,  CSN: [id number] Age: 71 Admit Type: Outpatient Procedure:                Colonoscopy Indications:              Follow-up of diverticulitis Providers:                Maylon Peppers, Rosina Lowenstein, RN, Crystal Pizzini,                            Ladoris Gene Technician, Technician Referring MD:              Medicines:                Monitored Anesthesia Care Complications:            No immediate complications. Estimated Blood Loss:     Estimated blood loss: none. Procedure:                Pre-Anesthesia Assessment:                           - Prior to the procedure, a History and Physical                            was performed, and patient medications, allergies                            and sensitivities were reviewed. The patient's                            tolerance of previous anesthesia was reviewed.                           - The risks and benefits of the procedure and the                            sedation options and risks were discussed with the                            patient. All questions were answered and informed                            consent was obtained.                           - ASA Grade Assessment: II - A patient with mild                            systemic disease.                           After obtaining informed consent, the colonoscope                            was passed under direct vision. Throughout the  procedure, the patient's blood pressure, pulse, and                            oxygen saturations were monitored continuously. The                            [id number]-HQ190L ([id number]) scope was introduced through                            the anus and advanced to the the cecum, identified                            by appendiceal orifice and ileocecal valve. The                             colonoscopy was performed without difficulty. The                            patient tolerated the procedure well. The quality                            of the bowel preparation was good. Scope In: 7:39:08 AM Scope Out: 8:02:57 AM Scope Withdrawal Time: 0 hours 12 minutes 51 seconds  Total Procedure Duration: 0 hours 23 minutes 49 seconds  Findings:      The perianal and digital rectal examinations were normal.      Three sessile polyps were found in the sigmoid colon and transverse       colon. The polyps were 4 to 6 mm in size. These polyps were removed with       a cold snare. Resection and retrieval were complete.      Scattered small and large-mouthed diverticula were found in the sigmoid       colon, descending colon and ascending colon.      The retroflexed view of the distal rectum and anal verge was normal and       showed no anal or rectal abnormalities. Impression:               - Three 4 to 6 mm polyps in the sigmoid colon and                            in the transverse colon, removed with a cold snare.                            Resected and retrieved.                           - Diverticulosis in the sigmoid colon, in the                            descending colon and in the ascending colon.                           - The distal rectum and anal verge are normal on  retroflexion view. Moderate Sedation:      Per Anesthesia Care Recommendation:           - Discharge patient to home (ambulatory).                           - Resume previous diet.                           - Await pathology results.                           - Repeat colonoscopy for surveillance based on                            pathology results.                           - No BC/Goody powders, ibuprofen, naproxen, or                            other non-steroidal anti-inflammatory drugs. Procedure Code(s):        --- Professional ---                            [phone], Colonoscopy, flexible; with removal of                            tumor(s), polyp(s), or other lesion(s) by snare                            technique Diagnosis Code(s):        --- Professional ---                           K63.5, Polyp of colon                           K57.32, Diverticulitis of large intestine without                            perforation or abscess without bleeding                           K57.30, Diverticulosis of large intestine without                            perforation or abscess without bleeding CPT copyright 2019 American Medical Association. All rights reserved. The codes documented in this report are preliminary and upon coder review may  be revised to meet current compliance requirements. Maylon Peppers, MD Maylon Peppers,  01/16/2022 8:08:04 AM This report has been signed electronically. Number of Addenda: 0

## 2022-01-17 LAB — SURGICAL PATHOLOGY

## 2022-01-17 NOTE — Anesthesia Postprocedure Evaluation (Signed)
Anesthesia Post Note  Patient: Paige Cole  Procedure(s) Performed: COLONOSCOPY WITH PROPOFOL POLYPECTOMY  Patient location during evaluation: Phase II Anesthesia Type: General Level of consciousness: awake Pain management: pain level controlled Vital Signs Assessment: post-procedure vital signs reviewed and stable Respiratory status: spontaneous breathing and respiratory function stable Cardiovascular status: blood pressure returned to baseline and stable Postop Assessment: no headache and no apparent nausea or vomiting Anesthetic complications: no Comments: Late entry   No notable events documented.   Last Vitals:  Vitals:   01/16/22 0654 01/16/22 0806  BP: (!) 163/86 114/60  Pulse: 93 79  Resp: 10 19  Temp: 36.8 C (!) 36.2 C  SpO2: 98% 97%    Last Pain:  Vitals:   01/16/22 0806  TempSrc: Oral  PainSc: [place]

## 2022-01-23 ENCOUNTER — Encounter (HOSPITAL_COMMUNITY): Payer: Self-pay | Admitting: Gastroenterology

## 2022-02-27 DIAGNOSIS — Z1283 Encounter for screening for malignant neoplasm of skin: Secondary | ICD-10-CM | POA: Diagnosis not present

## 2022-02-27 DIAGNOSIS — L821 Other seborrheic keratosis: Secondary | ICD-10-CM | POA: Diagnosis not present

## 2022-02-27 DIAGNOSIS — D485 Neoplasm of uncertain behavior of skin: Secondary | ICD-10-CM | POA: Diagnosis not present

## 2022-02-27 DIAGNOSIS — C44319 Basal cell carcinoma of skin of other parts of face: Secondary | ICD-10-CM | POA: Diagnosis not present

## 2022-02-27 DIAGNOSIS — D2262 Melanocytic nevi of left upper limb, including shoulder: Secondary | ICD-10-CM | POA: Diagnosis not present

## 2022-02-27 DIAGNOSIS — D225 Melanocytic nevi of trunk: Secondary | ICD-10-CM | POA: Diagnosis not present

## 2022-02-27 DIAGNOSIS — L814 Other melanin hyperpigmentation: Secondary | ICD-10-CM | POA: Diagnosis not present

## 2022-02-27 DIAGNOSIS — I781 Nevus, non-neoplastic: Secondary | ICD-10-CM | POA: Diagnosis not present

## 2022-03-05 ENCOUNTER — Ambulatory Visit (INDEPENDENT_AMBULATORY_CARE_PROVIDER_SITE_OTHER): Payer: Medicare HMO | Admitting: Gastroenterology

## 2022-03-05 ENCOUNTER — Encounter (INDEPENDENT_AMBULATORY_CARE_PROVIDER_SITE_OTHER): Payer: Self-pay | Admitting: Gastroenterology

## 2022-03-05 VITALS — BP 138/83 | HR 85 | Temp 98.2°F | Ht 62.0 in | Wt 160.2 lb

## 2022-03-05 DIAGNOSIS — R109 Unspecified abdominal pain: Secondary | ICD-10-CM | POA: Insufficient documentation

## 2022-03-05 DIAGNOSIS — R197 Diarrhea, unspecified: Secondary | ICD-10-CM | POA: Diagnosis not present

## 2022-03-05 DIAGNOSIS — K582 Mixed irritable bowel syndrome: Secondary | ICD-10-CM | POA: Insufficient documentation

## 2022-03-05 NOTE — Progress Notes (Signed)
Referring Provider: [name], DO Primary Care Physician:  [name], DO Primary GI Physician: Jenetta Downer   Chief Complaint  Patient presents with   Follow-up    Patient here today for a follow up visit from 12/03/2021. Patient denies any current gi issues.   HPI:   Shuna Segreto is a 71 y.o. female with past medical history of diverticulitis.   Patient presenting today for follow up  Last seen July 2023, at that time, recent hx of diverticulitis that she had been treated for with cipro and flagyl, though symptoms persisted, seen in ED thereafter without much change in CT findings, though treated with iv abx and discharged with cipro and flagyl x10 days. At last visit, stil having some nausea, feeling shaky, having occasional LLQ pain.  Recommended complete abx therapy, soft/low fiber diet, probiotic daily, schedule colonoscopy, zofran PRN, miralax for constipation.   Present: Patient reports no further LLQ pain. She has intermittent abdominal cramping with alternating diarrhea and constipation. Usually having a BM every day. Denies rectal bleeding or melena. Abdominal cramping improves with a BM. She has been keeping track of what she eats prior to abdominal cramping, noting that Ice cream tends to cause her symptoms therefore she is avoiding this.  She is trying to eat fiber though she is hesitant about it because some sources cause her more cramping like salads and apples. She denies changes in appetite or weight loss.    Last imaging: CT A/P 11/29/21: Long segment bowel wall thickening and extensive pericolonic inflammatory stranding involving the sigmoid colon. This appears similar to prior examination. Given its stability over time, this may represent a subacute to inflammatory process, as can be seen with subacute diverticulitis or inflammatory bowel disease, or a infiltrative mass. No evidence of obstruction or perforation. Correlation with endoscopy may be helpful for  further evaluation. 2. 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 Last Colonoscopy:01/16/22 - Three 4 to 6 mm polyps in the sigmoid colon and in the transverse colon-3 tubular adenomas - Diverticulosis in the sigmoid colon, in the descending colon and in the ascending colon. - The distal rectum and anal verge are normal on retroflexion view. Last Endoscopy:never  Recommendations  Repeat in 5 years  History reviewed. No pertinent past medical history.  Past Surgical History:  Procedure Laterality Date   ABDOMINAL HYSTERECTOMY     COLONOSCOPY N/A 06/08/2018   Procedure: COLONOSCOPY;  Surgeon: Rogene Houston, MD;  [facility];  Service: Endoscopy;  Laterality: N/A;  7:30   COLONOSCOPY WITH PROPOFOL N/A 01/16/2022   Procedure: COLONOSCOPY WITH PROPOFOL;  Surgeon: Harvel Quale, MD;  Location: [facility];  Service: Gastroenterology;  Laterality: N/A;  730 ASA 2   POLYPECTOMY  06/08/2018   Procedure: POLYPECTOMY;  Surgeon: Rogene Houston, MD;  [facility];  Service: Endoscopy;;  cold snare [place] x 1, DC x1   POLYPECTOMY  01/16/2022   Procedure: POLYPECTOMY;  Surgeon: Harvel Quale, MD;  [facility]: [facility];  Service: Gastroenterology;;    Current Outpatient Medications  Medication Sig Dispense Refill   dicyclomine (BENTYL) 10 MG capsule TAKE 1 CAPSULE BY MOUTH 2 TIMES DAILY AS NEEDED FOR SPASMS. 60 capsule 3   ondansetron (ZOFRAN) 4 MG tablet Take 1 tablet (4 mg total) by mouth every 8 (eight) hours as needed for nausea or vomiting. (Patient not taking: Reported on 01/14/2022) 30 tablet 3   No current facility-administered medications for this visit.  Allergies as of 03/05/2022 - Review Complete 03/05/2022  Allergen Reaction Noted   Penicillins Rash 04/03/2018    Family History  Problem Relation Age of Onset   Breast cancer Niece     Social History   Socioeconomic History   Marital status: Married    Spouse name:  Not on file   Number of children: Not on file   Years of education: Not on file   Highest education level: Not on file  Occupational History   Not on file  Tobacco Use   Smoking status: Never    Passive exposure: Past   Smokeless tobacco: Never  Vaping Use   Vaping Use: Never used  Substance and Sexual Activity   Alcohol use: Never   Drug use: Never   Sexual activity: Not Currently    Birth control/protection: None  Other Topics Concern   Not on file  Social History Narrative   Not on file   Social Determinants of Health   Financial Resource Strain: Not on file  Food Insecurity: Not on file  Transportation Needs: Not on file  Physical Activity: Not on file  Stress: Not on file  Social Connections: Not on file   Review of systems General: negative for malaise, night sweats, fever, chills, weight loss Neck: Negative for lumps, goiter, pain and significant neck swelling Resp: Negative for cough, wheezing, dyspnea at rest CV: Negative for chest pain, leg swelling, palpitations, orthopnea GI: denies melena, hematochezia, nausea, vomiting, dysphagia, odyonophagia, early satiety or unintentional weight loss. +lower abd cramping +constipation +diarrhea  MSK: Negative for joint pain or swelling, back pain, and muscle pain. Derm: Negative for itching or rash Psych: Denies depression, anxiety, memory loss, confusion. No homicidal or suicidal ideation.  Heme: Negative for prolonged bleeding, bruising easily, and swollen nodes. Endocrine: Negative for cold or heat intolerance, polyuria, polydipsia and goiter. Neuro: negative for tremor, gait imbalance, syncope and seizures. The remainder of the review of systems is noncontributory.  Physical Exam: BP 138/83 (BP Location: Left Arm, Patient Position: Sitting, Cuff Size: Large)   Pulse 85   Temp 98.2 F (36.8 C) (Oral)   Ht '5\' 2"'$  (1.575 m)   Wt 160 lb 3.2 oz (72.7 kg)   BMI 29.30 kg/m  General:   Alert and oriented. No distress  noted. Pleasant and cooperative.  Head:  Normocephalic and atraumatic. Eyes:  Conjuctiva clear without scleral icterus. Mouth:  Oral mucosa pink and moist. Good dentition. No lesions. Heart: Normal rate and rhythm, s1 and s2 heart sounds present.  Lungs: Clear lung sounds in all lobes. Respirations equal and unlabored. Abdomen:  +BS, soft, non-tender and non-distended. No rebound or guarding. No HSM or masses noted. Derm: No palmar erythema or jaundice Msk:  Symmetrical without gross deformities. Normal posture. Extremities:  Without edema. Neurologic:  Alert and  oriented x4 Psych:  Alert and cooperative. Normal mood and affect.  Invalid input(s): "6 MONTHS"   ASSESSMENT: Paige Cole is a 71 y.o. female presenting today for follow up of diverticulitis.   Diverticulitis in June/July, follow up colonoscopy in august with 3 tubular adenomas and diverticulosis. Patient denies any further LLQ pain. She has intermittent lower abdominal cramping and alternates between constipation and diarrhea. Some dairy makes her symptoms worse as do apples and sometimes salads. There has been suspicion of IBS which her current symptoms certainly correlate with.  I explained indications of IBS with the patient to include hypersensitivity of the bowels that is often heavily influenced by certain food/drink  triggers and emotional/mental triggers. We discussed the importance of keeping a food journal x2-3 weeks and utilizing the low FODMAP guide to help determine specific trigger foods and increasing foods that tend to be more well tolerated. I also discussed the use of anti spasmodics for this condition which help to calm down the activity in the bowels, decreasing cramping and looser stools. Patient does not wish to try anti spasmodics at this time. She should continue to avoid dairy or use lactaid prior to. Will also perform celiac testing to rule this out. Encouraged to start 1T benefiber BID with meals and make  sure water intake is good.    PLAN:  Low FODMAP diet, food journal 2. Avoid dairy or use lactaid prior to 3. Celiac panel 4. Stress management.  5. 1T benefiber BID   All questions were answered, patient verbalized understanding and is in agreement with plan as outlined above.   Follow Up: 1 year  Ivalee Strauser L. Alver Sorrow, MSN, APRN, AGNP-C Adult-Gerontology Nurse Practitioner [facility]  I have reviewed the note and agree with the APP's assessment as described in this progress note  Maylon Peppers, MD Gastroenterology and Hepatology [facility] Gastroenterology

## 2022-03-05 NOTE — Patient Instructions (Signed)
I suspect your symptoms are related to IBS, I am providing the low FODMAP diet You should keep a food journal for the next few weeks to help correlate if symptoms are related to certain foods You can try eliminating FODMAPS listed on the provided handout. If you notice no change in your symptoms, you can slowly start adding these foods/ingredients back in to see if they are in fact tolerated. You should avoid foods that tend to illicit your symptoms. Stress can also play a big role in IBS, so stress management is important  Will check blood work for celiac disease as well to rule this out. Please avoid dairy or use lactaid prior to consuming dairy products  You can try adding 1T benefiber twice daily with meals, it is important that you have a high amount of fiber intake given history of diverticulitis, this may also help to balance out stool consistency more  Follow up 1 year

## 2022-03-07 LAB — CELIAC DISEASE PANEL
Endomysial IgA: NEGATIVE
IgA/Immunoglobulin A, Serum: 396 mg/dL — ABNORMAL HIGH (ref 87–352)
Transglutaminase IgA: <2 U/mL (ref 0–3)

## 2022-03-20 DIAGNOSIS — C44311 Basal cell carcinoma of skin of nose: Secondary | ICD-10-CM | POA: Diagnosis not present

## 2022-05-08 ENCOUNTER — Ambulatory Visit: Payer: Medicare HMO | Admitting: Physician Assistant

## 2022-06-12 ENCOUNTER — Telehealth: Payer: Self-pay | Admitting: Family Medicine

## 2022-06-12 NOTE — Telephone Encounter (Signed)
  Left message for patient to call back and schedule Medicare Annual Wellness Visit (AWV) in office.   If unable to come into the office for AWV,  please offer to do virtually or by telephone.  No hx of AWV eligible for AWVI per [employer] as of 11/17/2017  Please schedule at anytime with [facility]-Nurse Health Advisor.      45 minute appointment   Any questions, please call me.  Thank you,   Stephanie  Ambulatory Clinical Support for [facility] You Are. We Are[facility] ??[phone] or ??[phone]

## 2022-09-10 ENCOUNTER — Telehealth: Payer: Self-pay | Admitting: Family Medicine

## 2022-09-10 NOTE — Telephone Encounter (Signed)
Contacted Paige Cole to schedule their annual wellness visit. Appointment made for 09/20/2022.  Thank you,  Judeth Cornfield,  AMB Clinical Support [facility] AWV Program Direct Dial ??[phone]

## 2022-09-11 DIAGNOSIS — L720 Epidermal cyst: Secondary | ICD-10-CM | POA: Diagnosis not present

## 2022-09-11 DIAGNOSIS — C44311 Basal cell carcinoma of skin of nose: Secondary | ICD-10-CM | POA: Diagnosis not present

## 2022-09-11 DIAGNOSIS — D1801 Hemangioma of skin and subcutaneous tissue: Secondary | ICD-10-CM | POA: Diagnosis not present

## 2022-09-20 ENCOUNTER — Ambulatory Visit: Payer: Medicare HMO

## 2022-09-20 ENCOUNTER — Ambulatory Visit (INDEPENDENT_AMBULATORY_CARE_PROVIDER_SITE_OTHER): Payer: Medicare HMO | Admitting: Family Medicine

## 2022-09-20 VITALS — BP 155/80 | HR 77 | Temp 97.5°F | Ht 62.0 in | Wt 162.0 lb

## 2022-09-20 DIAGNOSIS — G459 Transient cerebral ischemic attack, unspecified: Secondary | ICD-10-CM | POA: Diagnosis not present

## 2022-09-20 NOTE — Patient Instructions (Signed)
Labs today.  We will work on imaging.  Aspirin 81 mg daily.

## 2022-09-22 DIAGNOSIS — G459 Transient cerebral ischemic attack, unspecified: Secondary | ICD-10-CM | POA: Insufficient documentation

## 2022-09-22 NOTE — Assessment & Plan Note (Signed)
Normal exam today.  History concerning for TIA.  Proceeding with workup.  Labs ordered.  I have also ordered MRI, carotid Doppler, and echocardiogram.

## 2022-09-22 NOTE — Progress Notes (Signed)
Subjective:  Patient ID: Paige Cole, female    DOB: 06-May-1951  Age: 72 y.o. MRN: [id number]  CC: Chief Complaint  Patient presents with   forgetfullness    And confusion Wednesday episode lasting a little less than 30 mins Described as dreamlike- forgot grand child name, passcode ect Felt sleepy afterwards    HPI:  72 year old female presents for evaluation of the above.  Patient states that this past Wednesday she had an episode where she was slightly confused.  She states that she had difficulty remembering grandchild's name.  Also had difficulty remembering a few other things like a birthday and passcode to her husband's phone.  She states that this lasted about 30 minutes and then resolved.  She states that afterwards she felt foggy and tired.  He states that it felt like she was in a dreamlike state.  She states that this is very unusual for her and she is never had this before.  Patient also reports that she recently had palpitations last night.  She is concerned that she had a TIA.  Additionally, she has a headache today.  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   Obesity (BMI 30-39.9) 11/29/2021   History of diverticulitis 05/24/2019    Social Hx   Social History   Socioeconomic History   Marital status: Married    Spouse name: Not on file   Number of children: Not on file   Years of education: Not on file   Highest education level: 12th grade  Occupational History   Not on file  Tobacco Use   Smoking status: Never    Passive exposure: Past   Smokeless tobacco: Never  Vaping Use   Vaping Use: Never used  Substance and Sexual Activity   Alcohol use: Never   Drug use: Never   Sexual activity: Not Currently    Birth control/protection: None  Other Topics Concern   Not on file  Social History Narrative   Not on file   Social  Determinants of Health   Financial Resource Strain: Low Risk  (09/19/2022)   Overall Financial Resource Strain (CARDIA)    Difficulty of Paying Living Expenses: Not hard at all  Food Insecurity: No Food Insecurity (09/19/2022)   Hunger Vital Sign    Worried About Running Out of Food in the Last Year: Never true    Ran Out of Food in the Last Year: Never true  Transportation Needs: Unmet Transportation Needs (09/19/2022)   PRAPARE - Administrator, Civil Service (Medical): Yes    Lack of Transportation (Non-Medical): No  Physical Activity: Insufficiently Active (09/19/2022)   Exercise Vital Sign    Days of Exercise per Week: 1 day    Minutes of Exercise per Session: 20 min  Stress: No Stress Concern Present (09/19/2022)   [employer] of Occupational Health - Occupational Stress Questionnaire    Feeling of Stress : Only a little  Social Connections: Socially Integrated (09/19/2022)   Social Connection and Isolation Panel [NHANES]    Frequency of Communication with Friends and Family: More than three times a week    Frequency of Social Gatherings with Friends and Family: Twice a week    Attends Religious Services: More than 4 times per year    Active Member of [employer] or Organizations: Yes    Attends Banker Meetings: More than  4 times per year    Marital Status: Married    Review of Systems Per HPI  Objective:  BP (!) 155/80   Pulse 77   Temp (!) 97.5 F (36.4 C)   Ht 5\' 2"  (1.575 m)   Wt 162 lb (73.5 kg)   SpO2 98%   BMI 29.63 kg/m      09/20/2022    4:02 PM 03/05/2022    3:02 PM 01/16/2022    8:06 AM  BP/Weight  Systolic BP 155 138 114  Diastolic BP 80 83 60  Wt. (Lbs) 162 160.2   BMI 29.63 kg/m2 29.3 kg/m2     Physical Exam Vitals and nursing note reviewed.  Constitutional:      General: She is not in acute distress.    Appearance: Normal appearance.  HENT:     Head: Normocephalic and atraumatic.  Eyes:     General:        Right eye: No  discharge.        Left eye: No discharge.     Extraocular Movements: Extraocular movements intact.     Conjunctiva/sclera: Conjunctivae normal.     Pupils: Pupils are equal, round, and reactive to light.  Cardiovascular:     Rate and Rhythm: Normal rate.     Comments: Regularly irregular.  Suspected ectopy. Pulmonary:     Effort: Pulmonary effort is normal.     Breath sounds: Normal breath sounds. No wheezing, rhonchi or rales.  Neurological:     General: No focal deficit present.     Mental Status: She is alert.     Cranial Nerves: No cranial nerve deficit.     Motor: No weakness.  Psychiatric:        Mood and Affect: Mood normal.        Behavior: Behavior normal.     Lab Results  Component Value Date   WBC 5.6 11/30/2021   HGB 10.7 (L) 11/30/2021   HCT 34.2 (L) 11/30/2021   PLT 180 11/30/2021   GLUCOSE 114 (H) 11/30/2021   ALT 9 11/30/2021   AST 10 (L) 11/30/2021   NA 138 11/30/2021   K 3.3 (L) 11/30/2021   CL 106 11/30/2021   CREATININE 0.59 11/30/2021   BUN 6 (L) 11/30/2021   CO2 26 11/30/2021   EKG -interpretation: Normal sinus rhythm with a rate of 74.  Normal axis.  Q waves noted in V1 and V2.  No ST or T wave changes.  Assessment & Plan:   Problem List Items Addressed This Visit       Cardiovascular and Mediastinum   TIA (transient ischemic attack) - Primary    Normal exam today.  History concerning for TIA.  Proceeding with workup.  Labs ordered.  I have also ordered MRI, carotid Doppler, and echocardiogram.        Relevant Orders   CBC   CMP14+EGFR   Hemoglobin A1c   Lipid panel   TSH   EKG 12-Lead (Completed)   MR Brain Wo Contrast   ECHOCARDIOGRAM COMPLETE   US Carotid Duplex Bilateral   Follow-up:  Pending work up.  Everlene Other DO [facility]

## 2022-09-23 ENCOUNTER — Other Ambulatory Visit: Payer: Self-pay | Admitting: *Deleted

## 2022-09-23 ENCOUNTER — Other Ambulatory Visit: Payer: Self-pay | Admitting: Family Medicine

## 2022-09-23 ENCOUNTER — Ambulatory Visit (HOSPITAL_COMMUNITY)
Admission: RE | Admit: 2022-09-23 | Discharge: 2022-09-23 | Disposition: A | Discharge status: Home / Self Care | Payer: Medicare HMO | Source: Ambulatory Visit | Attending: Family Medicine | Admitting: Family Medicine

## 2022-09-23 DIAGNOSIS — G459 Transient cerebral ischemic attack, unspecified: Secondary | ICD-10-CM

## 2022-09-23 DIAGNOSIS — R7303 Prediabetes: Secondary | ICD-10-CM | POA: Diagnosis not present

## 2022-09-23 DIAGNOSIS — I639 Cerebral infarction, unspecified: Secondary | ICD-10-CM | POA: Diagnosis not present

## 2022-09-23 DIAGNOSIS — E785 Hyperlipidemia, unspecified: Secondary | ICD-10-CM | POA: Diagnosis not present

## 2022-09-24 ENCOUNTER — Ambulatory Visit (HOSPITAL_COMMUNITY)
Admission: RE | Admit: 2022-09-24 | Discharge: 2022-09-24 | Disposition: A | Discharge status: Home / Self Care | Payer: Medicare HMO | Source: Ambulatory Visit | Attending: Family Medicine | Admitting: Family Medicine

## 2022-09-24 ENCOUNTER — Other Ambulatory Visit: Payer: Self-pay | Admitting: Family Medicine

## 2022-09-24 ENCOUNTER — Encounter (HOSPITAL_COMMUNITY): Payer: Self-pay

## 2022-09-24 ENCOUNTER — Ambulatory Visit (HOSPITAL_COMMUNITY): Payer: Medicare HMO

## 2022-09-24 DIAGNOSIS — I639 Cerebral infarction, unspecified: Secondary | ICD-10-CM | POA: Diagnosis not present

## 2022-09-24 DIAGNOSIS — G9389 Other specified disorders of brain: Secondary | ICD-10-CM | POA: Diagnosis not present

## 2022-09-24 DIAGNOSIS — R9389 Abnormal findings on diagnostic imaging of other specified body structures: Secondary | ICD-10-CM

## 2022-09-24 LAB — CMP14+EGFR
ALT: 14 IU/L (ref 0–32)
AST: 14 IU/L (ref 0–40)
Albumin/Globulin Ratio: 1.8 (ref 1.2–2.2)
Albumin: 4.2 g/dL (ref 3.9–4.9)
Alkaline Phosphatase: 90 IU/L (ref 44–121)
BUN/Creatinine Ratio: 23 (ref 12–28)
BUN: 17 mg/dL (ref 8–27)
Bilirubin Total: 0.5 mg/dL (ref 0.0–1.2)
CO2: 24 mmol/L (ref 20–29)
Calcium: 9.7 mg/dL (ref 8.7–10.3)
Chloride: 102 mmol/L (ref 96–106)
Creatinine, Ser: 0.75 mg/dL (ref 0.57–1.00)
Globulin, Total: 2.3 g/dL (ref 1.5–4.5)
Glucose: 88 mg/dL (ref 70–99)
Potassium: 4.4 mmol/L (ref 3.5–5.2)
Sodium: 141 mmol/L (ref 134–144)
Total Protein: 6.5 g/dL (ref 6.0–8.5)
eGFR: 86 mL/min/{1.73_m2} (ref >59)

## 2022-09-24 LAB — CBC
Hematocrit: 43.2 % (ref 34.0–46.6)
Hemoglobin: 13.8 g/dL (ref 11.1–15.9)
MCH: 27.2 pg (ref 26.6–33.0)
MCHC: 31.9 g/dL (ref 31.5–35.7)
MCV: 85 fL (ref 79–97)
Platelets: 243 10*3/uL (ref 150–450)
RBC: 5.07 x10E6/uL (ref 3.77–5.28)
RDW: 13.6 % (ref 11.7–15.4)
WBC: 4.3 10*3/uL (ref 3.4–10.8)

## 2022-09-24 LAB — LIPID PANEL
Chol/HDL Ratio: 4.6 ratio — ABNORMAL HIGH (ref 0.0–4.4)
Cholesterol, Total: 230 mg/dL — ABNORMAL HIGH (ref 100–199)
HDL: 50 mg/dL (ref >39)
LDL Chol Calc (NIH): 158 mg/dL — ABNORMAL HIGH (ref 0–99)
Triglycerides: 124 mg/dL (ref 0–149)
VLDL Cholesterol Cal: 22 mg/dL (ref 5–40)

## 2022-09-24 LAB — HEMOGLOBIN A1C
Est. average glucose Bld gHb Est-mCnc: 120 mg/dL
Hgb A1c MFr Bld: 5.8 % — ABNORMAL HIGH (ref 4.8–5.6)

## 2022-09-24 LAB — TSH: TSH: 2.92 u[IU]/mL (ref 0.450–4.500)

## 2022-09-24 MED ORDER — IOHEXOL 350 MG/ML SOLN
75.0000 mL | Freq: Once | INTRAVENOUS | Status: AC | PRN
  Administered 2022-09-24: 75 mL via INTRAVENOUS

## 2022-09-25 ENCOUNTER — Ambulatory Visit (HOSPITAL_COMMUNITY): Payer: Medicare HMO | Attending: Cardiology

## 2022-09-25 DIAGNOSIS — G459 Transient cerebral ischemic attack, unspecified: Secondary | ICD-10-CM | POA: Diagnosis not present

## 2022-09-25 LAB — ECHOCARDIOGRAM COMPLETE
Area-P 1/2: 2.78 cm2
S' Lateral: 2.9 cm

## 2022-09-26 NOTE — Addendum Note (Signed)
Addended by: Margaretha Sheffield on: 09/26/2022 08:17 AM   Modules accepted: Orders

## 2022-09-30 ENCOUNTER — Ambulatory Visit: Payer: Medicare HMO | Admitting: Neurology

## 2022-09-30 ENCOUNTER — Encounter: Payer: Self-pay | Admitting: Neurology

## 2022-09-30 ENCOUNTER — Other Ambulatory Visit: Payer: Self-pay | Admitting: Neurology

## 2022-09-30 ENCOUNTER — Other Ambulatory Visit: Payer: Self-pay | Admitting: Family Medicine

## 2022-09-30 ENCOUNTER — Encounter: Payer: Self-pay | Admitting: *Deleted

## 2022-09-30 ENCOUNTER — Other Ambulatory Visit (INDEPENDENT_AMBULATORY_CARE_PROVIDER_SITE_OTHER): Payer: Medicare HMO

## 2022-09-30 VITALS — BP 155/72 | HR 78 | Ht 62.0 in | Wt 162.2 lb

## 2022-09-30 DIAGNOSIS — I639 Cerebral infarction, unspecified: Secondary | ICD-10-CM | POA: Diagnosis not present

## 2022-09-30 DIAGNOSIS — R29818 Other symptoms and signs involving the nervous system: Secondary | ICD-10-CM

## 2022-09-30 DIAGNOSIS — R42 Dizziness and giddiness: Secondary | ICD-10-CM

## 2022-09-30 MED ORDER — ROSUVASTATIN CALCIUM 20 MG PO TABS
20.0000 mg | ORAL_TABLET | Freq: Every day | ORAL | 3 refills | Status: DC
Start: 2022-09-30 — End: 2023-01-21

## 2022-09-30 NOTE — Addendum Note (Signed)
Addended by: Margaretha Sheffield on: 09/30/2022 09:57 AM   Modules accepted: Orders

## 2022-09-30 NOTE — Progress Notes (Unsigned)
Enrolled for [employer] to mail a ZIO AT Live Telemetry monitor to patients address on file.   DOD to read. 

## 2022-09-30 NOTE — Patient Instructions (Signed)
Good to meet you.  Schedule EEG  2. Schedule 2-week holter monitor  3. Schedule repeat brain MRI with and without contrast for August 2024  4. Continue daily aspirin  5. Discuss cholesterol control with Dr. Adriana Simas  6. Continue working on control of blood pressure, sugar, cholesterol levels  7. For any sudden change in symptoms, go to ER immediately

## 2022-09-30 NOTE — Progress Notes (Signed)
NEUROLOGY CONSULTATION NOTE  Paige Cole MRN: [id number] DOB: 1951/04/30  Referring provider: Dr. Everlene Other Primary care provider: Dr. Everlene Other  Reason for consult:  stroke  Dear Dr Adriana Simas:  Thank you for your kind referral of Paige Cole for consultation of the above symptoms. Although her history is well known to you, please allow me to reiterate it for the purpose of our medical record. The patient was accompanied to the clinic by her husband Ed who also provides collateral information. Records and images were personally reviewed where available.   HISTORY OF PRESENT ILLNESS: This is a very pleasant 72 year old right-handed woman with a history of diverticulitis, presenting for evaluation of stroke. She was in her usual state of health until 09/18/22, she was feeling fine and just finished reading her bible when she picked up her phone to look at the calendar for a birthday. She states she "could not make heads or tails" of it, she was looking at a name/letters, but read some other name. It did not look real, feeling dreamlike when she was looking at the calendar. She could not think of names of her extended family. Her daughter called and talked to her, with no speech changes noted. Her husband's phone went off with a text message, she could not think of the password but knew how to look it up. It lasted 15 minutes. She felt normal mentally after but did feel tired and took 2 naps. She felt completely fine after the second nap. There was no associated headache, dizziness, focal numbness/tingling/weakness. No further similar episodes, but since then she has had episodes of feeling lightheaded while sitting. She would feel it for 1-2 seconds after looking up. She feels better when she is up and moving around. She has had pings/brief sharp pains on either side of her head.She has been having palpitations off and on, and was having them a lot after the incident, with heavy pain in the  mid-chest. She has not had them in the past 2 days.  She denies any staring/unresponsive episodes, gaps in time, olfactory/gustatory hallucinations, deja vu, rising epigastric sensation, focal numbness/tingling/weakness, myoclonic jerks. She has a history of headaches for several years that got better after she retired 5 years ago, none recently. They mostly occur when she needs caffeine, there is some nausea, no photo/phonophobia. She denies any diplopia, dysarthria/dysphagia, neck/back pain, bowel/bladder dysfunction. She gets 8 hours of sleep. No alcohol or tobacco use. She had a normal birth and early development.  There is no history of febrile convulsions, CNS infections such as meningitis/encephalitis, significant traumatic brain injury, neurosurgical procedures, or family history of seizures.  I personally reviewed brain MRI without contrast done 09/23/22 which showed a 9mm focus of restricted diffusion and T2 FLAIR hyperintense signal abnormality within the medial left temporal (with involvement of the hippocampus). There was also a 6mm T2 hyperintense parenchymal focus along the anteroinferior aspect of the third ventricle on the left, with minimal surrounding FLAIR hyperintensity, possibly a prominent perivascular space but interval MRI was recommended. Echocardiogram showed EF 55-60%, normal left atrium. CTA head and neck no significant stenosis. She was started on aspirin 81mg  daily. She had a lipid panel with LDL of 158. HbA1c 5.8. BP today 155/72, she notes at home it runs in 133/70-80s range.   Lab Results  Component Value Date   CHOL 230 (H) 09/23/2022   HDL 50 09/23/2022   LDLCALC 158 (H) 09/23/2022   TRIG 124 09/23/2022   CHOLHDL  4.6 (H) 09/23/2022   Lab Results  Component Value Date   HGBA1C 5.8 (H) 09/23/2022     PAST MEDICAL HISTORY: History reviewed. No pertinent past medical history.  PAST SURGICAL HISTORY: Past Surgical History:  Procedure Laterality Date   ABDOMINAL  HYSTERECTOMY     COLONOSCOPY N/A 06/08/2018   Procedure: COLONOSCOPY;  Surgeon: Malissa Hippo, MD;  Location: [facility];  Service: Endoscopy;  Laterality: N/A;  7:30   COLONOSCOPY WITH PROPOFOL N/A 01/16/2022   Procedure: COLONOSCOPY WITH PROPOFOL;  Surgeon: Dolores Frame, MD;  Location: [facility];  Service: Gastroenterology;  Laterality: N/A;  730 ASA 2   POLYPECTOMY  06/08/2018   Procedure: POLYPECTOMY;  Surgeon: Malissa Hippo, MD;  Location: [facility];  Service: Endoscopy;;  cold snare [employer] x 1, [place] x1   POLYPECTOMY  01/16/2022   Procedure: POLYPECTOMY;  Surgeon: Dolores Frame, MD;  Location: [facility];  Service: Gastroenterology;;    MEDICATIONS: Current Outpatient Medications on File Prior to Visit  Medication Sig Dispense Refill   aspirin EC (ADULT ASPIRIN REGIMEN) 81 MG tablet Take 81 mg by mouth once.     dicyclomine (BENTYL) 10 MG capsule TAKE 1 CAPSULE BY MOUTH 2 TIMES DAILY AS NEEDED FOR SPASMS. 60 capsule 3   ondansetron (ZOFRAN) 4 MG tablet Take 1 tablet (4 mg total) by mouth every 8 (eight) hours as needed for nausea or vomiting. 30 tablet 3   No current facility-administered medications on file prior to visit.    ALLERGIES: Allergies  Allergen Reactions   Penicillins Rash    Has patient had a PCN reaction causing immediate rash, facial/tongue/throat swelling, SOB or lightheadedness with hypotension: No Has patient had a PCN reaction causing severe rash involving mucus membranes or skin necrosis: No Has patient had a PCN reaction that required hospitalization: No Has patient had a PCN reaction occurring within the last 10 years: No If all of the above answers are "NO", then may proceed with Cephalosporin use.     FAMILY HISTORY: Family History  Problem Relation Age of Onset   Breast cancer Niece     SOCIAL HISTORY: Social History   Socioeconomic History   Marital status: Married    Spouse name: Not on file   Number of  children: Not on file   Years of education: Not on file   Highest education level: 12th grade  Occupational History   Not on file  Tobacco Use   Smoking status: Never    Passive exposure: Past   Smokeless tobacco: Never  Vaping Use   Vaping Use: Never used  Substance and Sexual Activity   Alcohol use: Never   Drug use: Never   Sexual activity: Not Currently    Birth control/protection: None  Other Topics Concern   Not on file  Social History Narrative   Are you right handed or left handed? Right    Are you currently employed ? Retired    What is your current occupation?   Do you live at home alone? No    Who lives with you? Family    What type of home do you live in: 1 story or 2 story?  Steps to basement        Social Determinants of Health   Financial Resource Strain: Low Risk  (09/19/2022)   Overall Financial Resource Strain (CARDIA)    Difficulty of Paying Living Expenses: Not hard at all  Food Insecurity: No Food Insecurity (09/19/2022)  Hunger Vital Sign    Worried About Running Out of Food in the Last Year: Never true    Ran Out of Food in the Last Year: Never true  Transportation Needs: Unmet Transportation Needs (09/19/2022)   PRAPARE - Administrator, Civil Service (Medical): Yes    Lack of Transportation (Non-Medical): No  Physical Activity: Insufficiently Active (09/19/2022)   Exercise Vital Sign    Days of Exercise per Week: 1 day    Minutes of Exercise per Session: 20 min  Stress: No Stress Concern Present (09/19/2022)   [employer] of Occupational Health - Occupational Stress Questionnaire    Feeling of Stress : Only a little  Social Connections: Socially Integrated (09/19/2022)   Social Connection and Isolation Panel [NHANES]    Frequency of Communication with Friends and Family: More than three times a week    Frequency of Social Gatherings with Friends and Family: Twice a week    Attends Religious Services: More than 4 times per year     Active Member of [employer] or Organizations: Yes    Attends Engineer, structural: More than 4 times per year    Marital Status: Married  Catering manager Violence: Not on file     PHYSICAL EXAM: Vitals:   09/30/22 1249  BP: (!) 155/72  Pulse: 78  SpO2: 97%   General: No acute distress Head:  Normocephalic/atraumatic Skin/Extremities: No rash, no edema Neurological Exam: Mental status: alert and oriented to person, place, and time, no dysarthria or aphasia, Fund of knowledge is appropriate.  Recent and remote memory are intact, 3/3 delayed recall.  Attention and concentration are normal, 5/5 WORLD backward.    Able to name objects, read, and repeat phrases. Cranial nerves: CN I: not tested CN II: pupils equal, round, visual fields intact CN III, IV, VI:  full range of motion, no nystagmus, no ptosis CN V: facial sensation intact CN VII: upper and lower face symmetric CN VIII: hearing intact to conversation Bulk & Tone: normal, no fasciculations. Motor: 5/5 throughout with no pronator drift. Sensation: intact to light touch, cold, pin, vibration sense.  No extinction to double simultaneous stimulation.  Romberg test negative Deep Tendon Reflexes: +1 throughout Cerebellar: no incoordination on finger to nose testing Gait: narrow-based and steady, able to tandem walk adequately. Tremor: none   IMPRESSION: This is a very pleasant 72 year old right-handed woman with a history of diverticulitis, presenting for evaluation of stroke. She had a transient 15-minute episode on of confusion where she read a different name on her phone and could not recall her husband's phone code on 09/18/22. Neurological exam today normal. Brain MRI on 5/6 showed 9mm focus of restricted diffusion and T2 FLAIR hyperintense signal abnormality within the medial left temporal (with involvement of the hippocampus). Given appearance and provided history, this was felt to likely reflect a small acute/early  subacute infarct. Seizure is also a possibility, EEG will be ordered. Stroke workup has been unrevealing with normal CTA head/neck and echocardiogram, a 2-week holter monitor will be ordered as she also reports palpitations. MRI brain also showed a 6mm T2 hyperintense parenchymal focus along the anteroinferior aspect of the third ventricle on the left, possibly a prominent perivascular space but interval MRI was recommended. Repeat MRI will be ordered for August 2024. Continue daily aspirin and control of vascular risk factors for secondary stroke prevention, goal LDL <70. She will discuss statin concerns with Dr. Adriana Simas and plans for lifestyle modifications. She knows  to go to the ER for any sudden change in symptoms. Follow-up in 3 months, call for any changes.   Thank you for allowing me to participate in the care of this patient. Please do not hesitate to call for any questions or concerns.   Patrcia Dolly, M.D.  CC: Dr. Adriana Simas

## 2022-10-07 ENCOUNTER — Ambulatory Visit: Payer: Medicare HMO | Admitting: Neurology

## 2022-10-07 DIAGNOSIS — I639 Cerebral infarction, unspecified: Secondary | ICD-10-CM

## 2022-10-07 DIAGNOSIS — R29818 Other symptoms and signs involving the nervous system: Secondary | ICD-10-CM

## 2022-10-07 DIAGNOSIS — R42 Dizziness and giddiness: Secondary | ICD-10-CM | POA: Diagnosis not present

## 2022-10-07 DIAGNOSIS — R410 Disorientation, unspecified: Secondary | ICD-10-CM | POA: Diagnosis not present

## 2022-10-07 NOTE — Progress Notes (Signed)
EEG complete - results pending 

## 2022-10-08 DIAGNOSIS — R42 Dizziness and giddiness: Secondary | ICD-10-CM | POA: Diagnosis not present

## 2022-10-08 DIAGNOSIS — I639 Cerebral infarction, unspecified: Secondary | ICD-10-CM | POA: Diagnosis not present

## 2022-10-13 ENCOUNTER — Encounter: Payer: Self-pay | Admitting: Neurology

## 2022-10-13 IMAGING — MG MM DIGITAL SCREENING BILAT W/ TOMO AND CAD
8 series · 8 of 24 positions shown · non-contrast
Comparison: Previous exam(s).

CLINICAL DATA: Screening.

EXAM:
DIGITAL SCREENING BILATERAL MAMMOGRAM WITH TOMOSYNTHESIS AND CAD
TECHNIQUE: Bilateral screening digital craniocaudal and mediolateral oblique
mammograms were obtained. Bilateral screening digital breast
tomosynthesis was performed. The images were evaluated with
computer-aided detection.

[L MLO synth-2D]
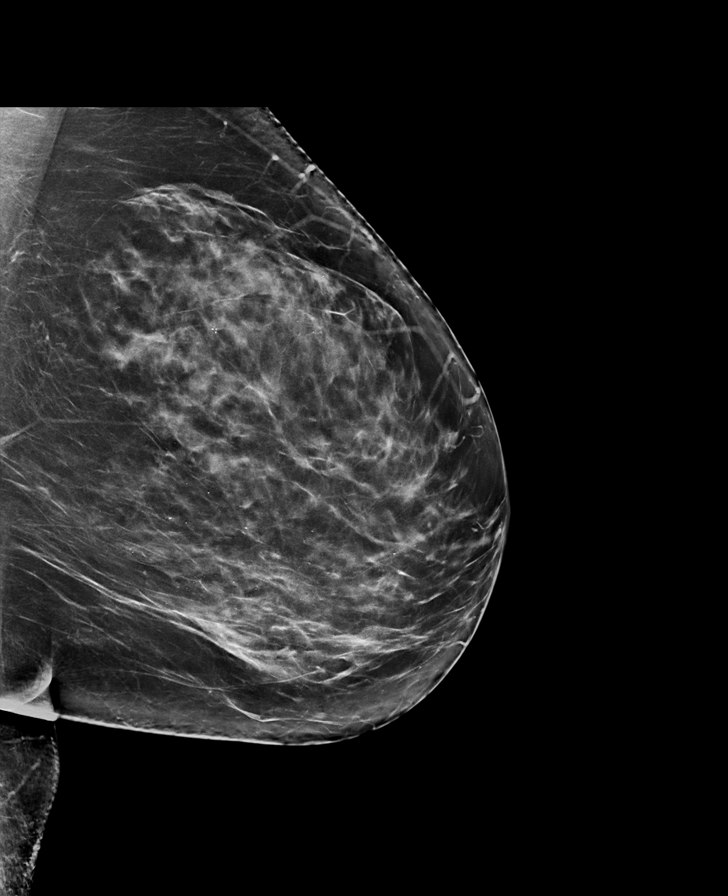

[R MLO synth-2D]
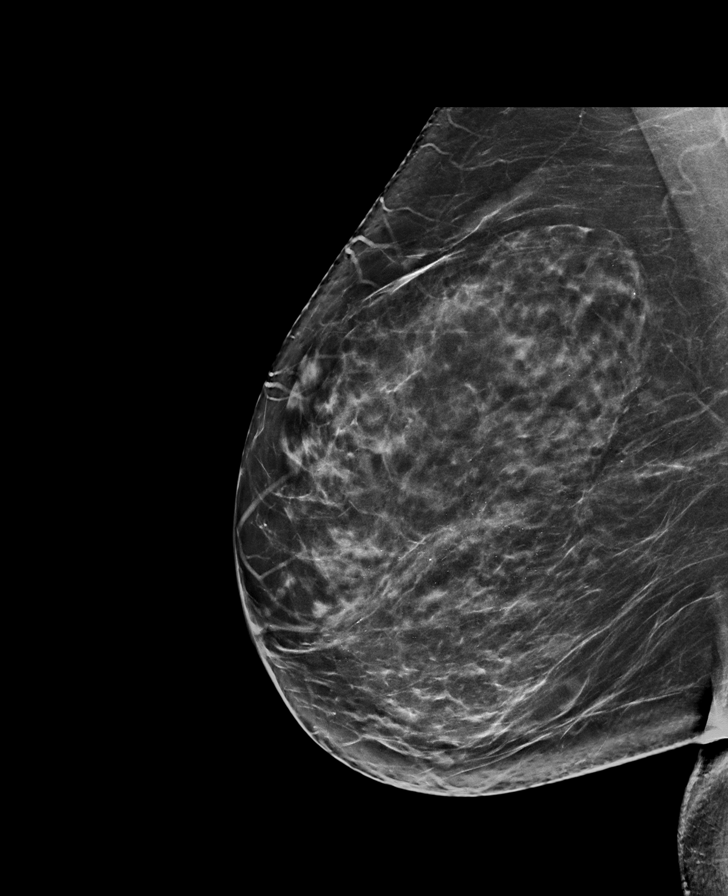

[R CC synth-2D]
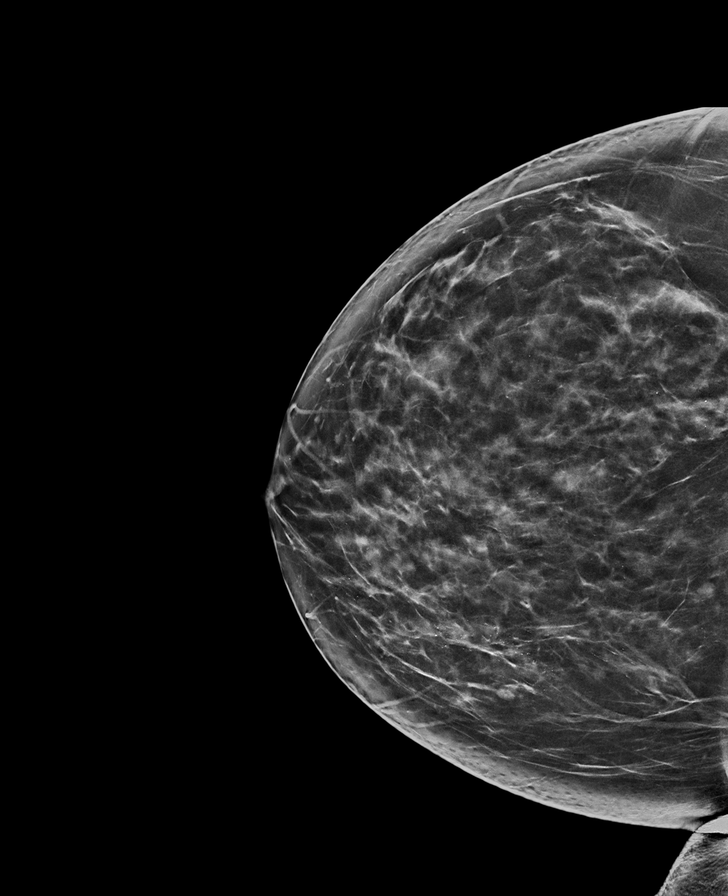

[L CC synth-2D]
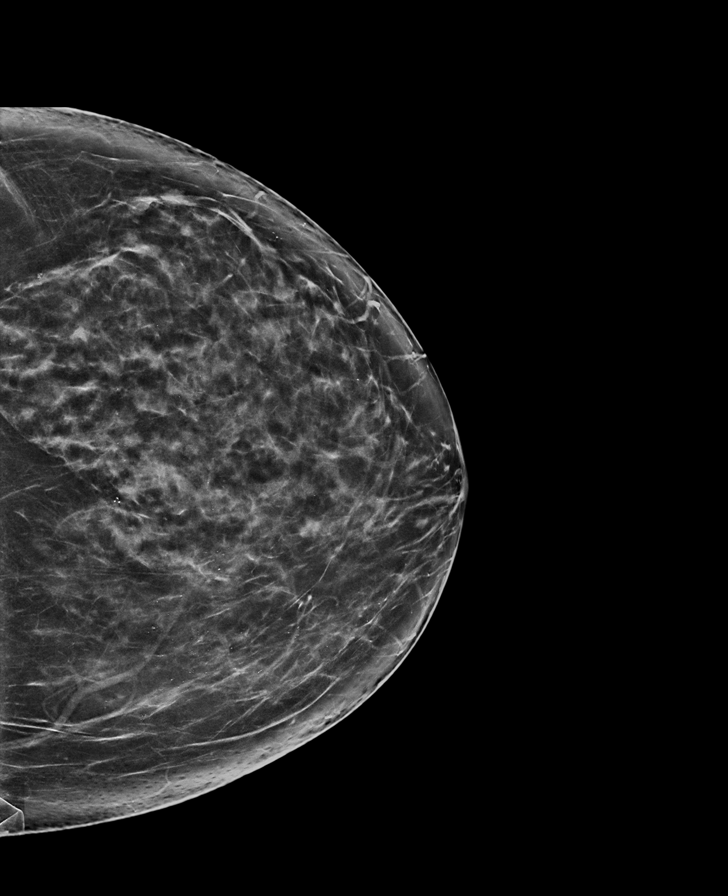

[R CC tomo · tomo slice 41/80.0]
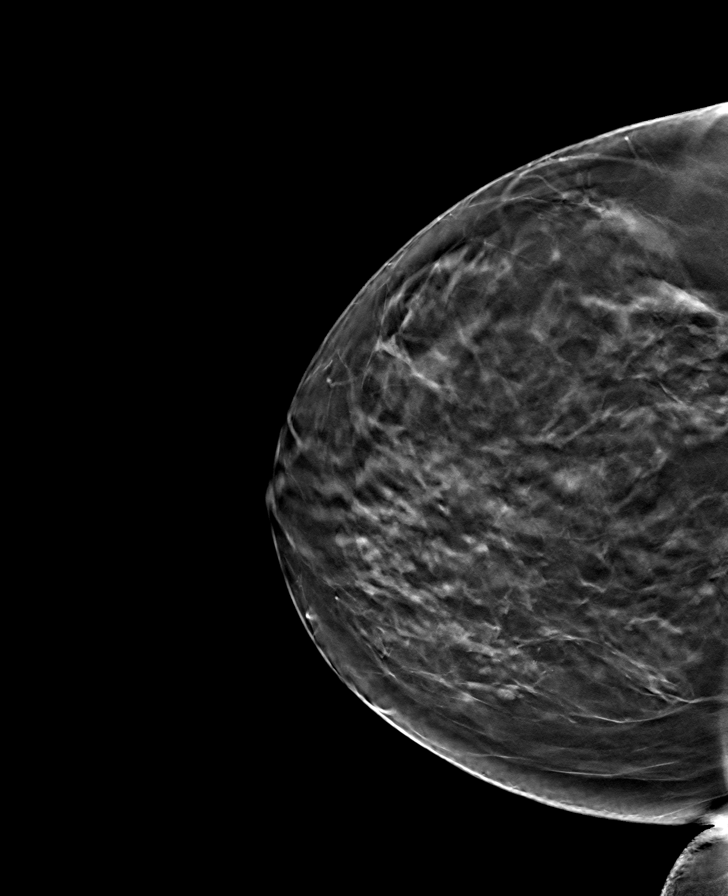

[R MLO tomo · tomo slice 41/82.0]
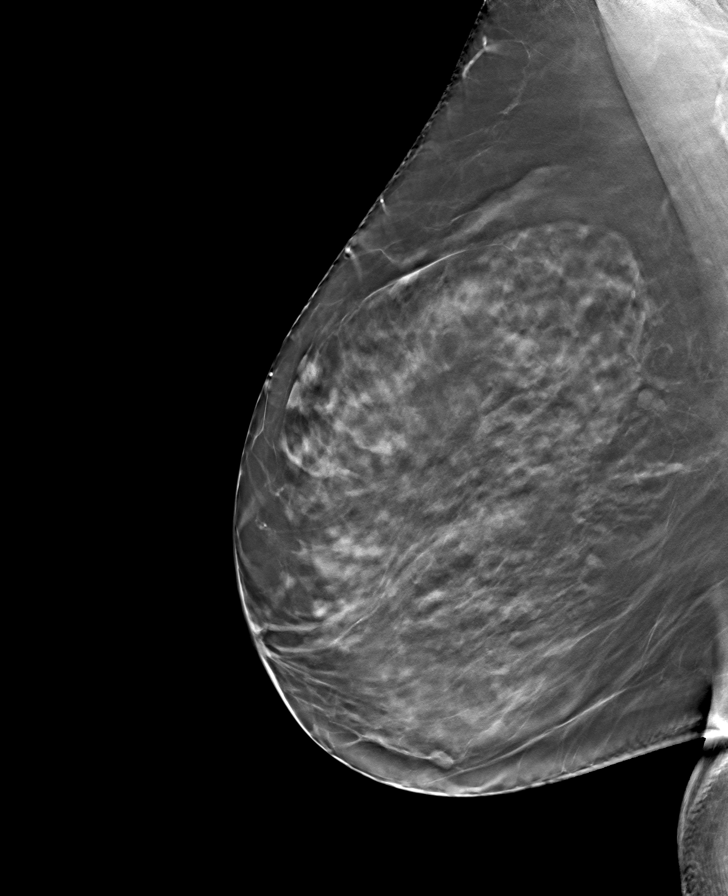

[L MLO tomo · tomo slice 43/84.0]
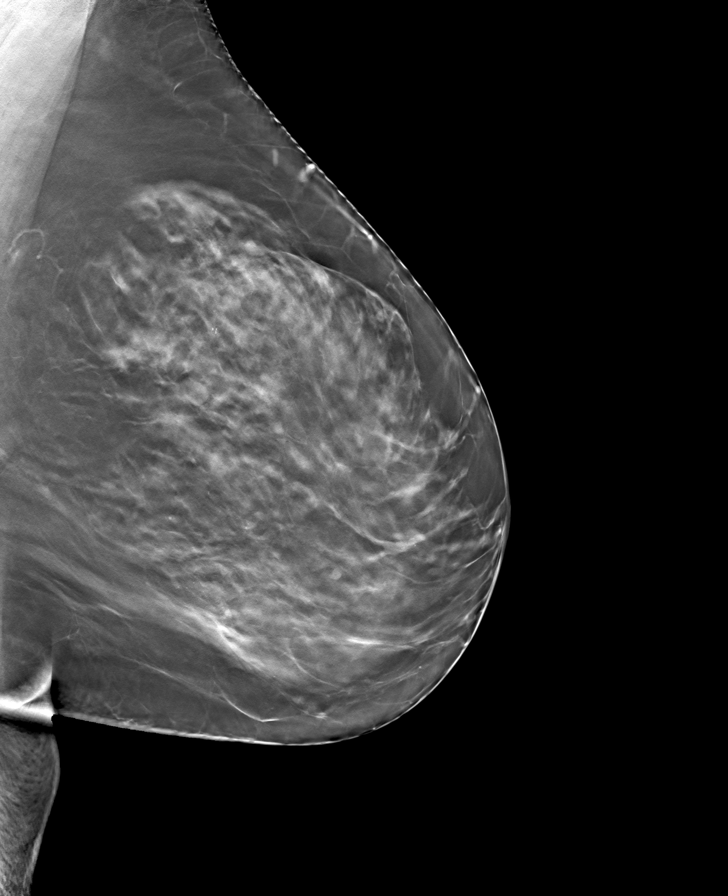

[L CC tomo · tomo slice 40/79.0]
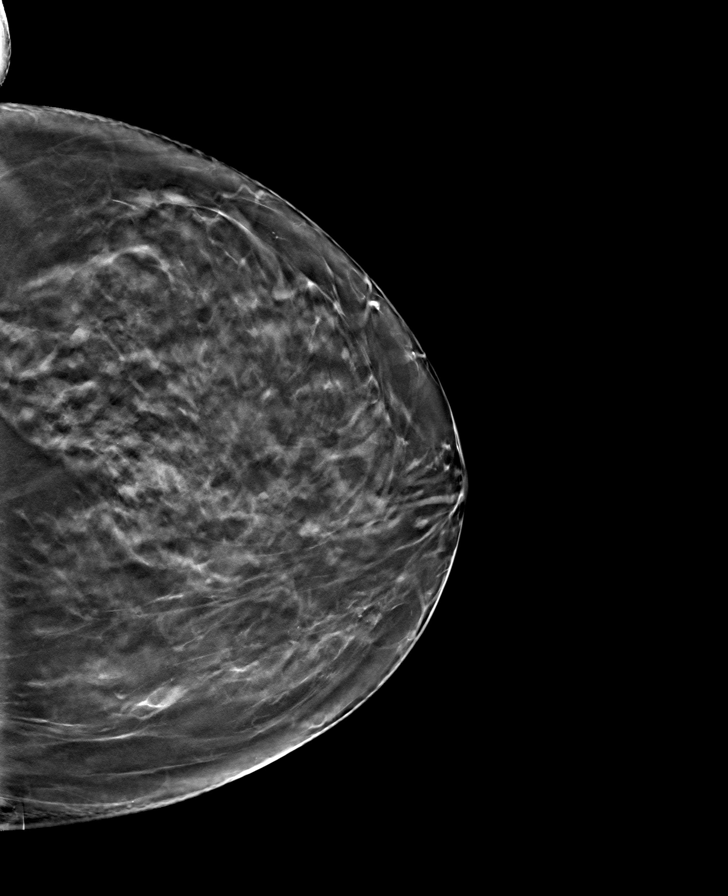

[8 of 24 positions shown; findings below may reference images not displayed]

ACR Breast Density Category c: The breast tissue is heterogeneously
dense, which may obscure small masses.
FINDINGS: There are no findings suspicious for malignancy. The images were
evaluated with computer-aided detection.
IMPRESSION: No mammographic evidence of malignancy. A result letter of this
screening mammogram will be mailed directly to the patient.

RECOMMENDATION:
Screening mammogram in one year. (Code:T4-5-GWO)

BI-RADS CATEGORY  1: Negative.

## 2022-10-16 ENCOUNTER — Other Ambulatory Visit (HOSPITAL_COMMUNITY): Payer: Medicare HMO

## 2022-10-17 NOTE — Procedures (Signed)
ELECTROENCEPHALOGRAM REPORT  Date of Study: 10/07/2022  Patient's Name: Paige Cole MRN: [id number] Date of Birth: 1951-04-03  Referring Provider: Dr. Patrcia Dolly  Clinical History: This is a 72 year old woman with a transient episode of confusion. EEG for classification.  Medications: Aspirin, Bentyl, Crestor  Technical Summary: A multichannel digital 1-hour EEG recording measured by the international 10-20 system with electrodes applied with paste and impedances below 5000 ohms performed in our laboratory with EKG monitoring in an awake and asleep patient.  Hyperventilation was not performed. Photic stimulation was performed.  The digital EEG was referentially recorded, reformatted, and digitally filtered in a variety of bipolar and referential montages for optimal display.    Description: The patient is awake and asleep during the recording.  During maximal wakefulness, there is a symmetric, medium voltage 9 Hz posterior dominant rhythm that attenuates with eye opening.  The record is symmetric.  During drowsiness and sleep, there is an increase in theta slowing of the background.  Vertex waves and symmetric sleep spindles were seen. Photic stimulation did not elicit any abnormalities.  There were no epileptiform discharges or electrographic seizures seen.    EKG lead was unremarkable.  Impression: This 1-hour awake and asleep EEG is normal.    Clinical Correlation: A normal EEG does not exclude a clinical diagnosis of epilepsy.  If further clinical questions remain, prolonged EEG may be helpful.  Clinical correlation is advised.   Patrcia Dolly, M.D.

## 2022-10-25 ENCOUNTER — Other Ambulatory Visit (HOSPITAL_COMMUNITY): Payer: Medicare HMO

## 2022-11-05 ENCOUNTER — Other Ambulatory Visit: Payer: Self-pay | Admitting: Family Medicine

## 2022-11-05 DIAGNOSIS — I639 Cerebral infarction, unspecified: Secondary | ICD-10-CM | POA: Insufficient documentation

## 2022-11-06 ENCOUNTER — Other Ambulatory Visit: Payer: Self-pay

## 2022-11-06 DIAGNOSIS — R002 Palpitations: Secondary | ICD-10-CM

## 2022-11-18 ENCOUNTER — Encounter: Payer: Self-pay | Admitting: Family Medicine

## 2022-11-18 DIAGNOSIS — E785 Hyperlipidemia, unspecified: Secondary | ICD-10-CM | POA: Insufficient documentation

## 2022-11-18 DIAGNOSIS — R7303 Prediabetes: Secondary | ICD-10-CM | POA: Insufficient documentation

## 2022-11-25 ENCOUNTER — Encounter (INDEPENDENT_AMBULATORY_CARE_PROVIDER_SITE_OTHER): Payer: Self-pay | Admitting: Gastroenterology

## 2022-11-25 ENCOUNTER — Ambulatory Visit (INDEPENDENT_AMBULATORY_CARE_PROVIDER_SITE_OTHER): Payer: Medicare HMO | Admitting: Gastroenterology

## 2022-11-25 VITALS — BP 158/83 | HR 84 | Temp 98.1°F | Ht 62.0 in | Wt 163.3 lb

## 2022-11-25 DIAGNOSIS — R002 Palpitations: Secondary | ICD-10-CM

## 2022-11-25 DIAGNOSIS — R0789 Other chest pain: Secondary | ICD-10-CM | POA: Diagnosis not present

## 2022-11-25 MED ORDER — PANTOPRAZOLE SODIUM 40 MG PO TBEC: 40.0000 mg | DELAYED_RELEASE_TABLET | Freq: Every day | ORAL | 2 refills | Status: DC

## 2022-11-25 NOTE — Progress Notes (Signed)
Referring Provider: Tommie Sams, DO Primary Care Physician:  Tommie Sams, DO Primary GI Physician:   Chief Complaint  Patient presents with   Abdominal Pain    Having epigastric pain. Wonders if she has hernia. Started around May 1st off and on. States she had stroke on May 1st and that's when symptoms started. Pain when eating and laying down. Has palpitations. States she wore heart monitor for 2 weeks.    HPI:   Paige Cole is a 72 y.o. female with past medical history of diverticulitis, stroke   Patient presenting today for chest pressure/palpitations with eating   Last seen October 2023, at that time, following up for diverticulitis. No further LLQ pain. Some diarrhea/constipation/abdominal cramping at times.   Recommended low FODMAP diet, avoid dairy/lactaid before, celiac panel (negative), Stress management, 1T benefiber BID.  Present:  Patient notes that she had a stroke on May 1st.  She has noted some palpitations with pain into her left chest. She notes that laying down and eating will both bring on palpitations and chest pain. She feels that both palpitations and chest pain come on about the same time. She denies current GERD symptoms. She denies pain in her stomach. She has taken pepto on occasion at times of chest pressure which seemed to help. She notes somewhat of a pressure in her mid chest/epigastric area noting this 'feels like I swallowed an egg" this sensation can happen anytime but worse when she has palpitations. She denies dysphagia or odynophagia. She notes she did the low FODMAP diet previously recommended and had no further episodes of vomiting or abdominal cramping. She is seeing cardiology in August, recent holter monitor with HR as high as 240 BPM. She notes appetite is good. She feels hungry all the time. No weight loss. She is supposed to be on a baby aspirin but stopped this as it irritates her stomach and causes burning.   Last imaging: CT A/P 11/29/21:  Long segment bowel wall thickening and extensive pericolonic inflammatory stranding involving the sigmoid colon. This appears similar to prior examination. Given its stability over time, this may represent a subacute to inflammatory process, as can be seen with subacute diverticulitis or inflammatory bowel disease, or a infiltrative mass. No evidence of obstruction or perforation. Correlation with endoscopy may be helpful for further evaluation. 2. 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 Last Colonoscopy:01/16/22 - Three 4 to 6 mm polyps in the sigmoid colon and in the transverse colon-3 tubular adenomas - Diverticulosis in the sigmoid colon, in the descending colon and in the ascending colon. - The distal rectum and anal verge are normal on retroflexion view. Last Endoscopy:never   Recommendations  Repeat in 5 years  Past Surgical History:  Procedure Laterality Date   ABDOMINAL HYSTERECTOMY     COLONOSCOPY N/A 06/08/2018   Procedure: COLONOSCOPY;  Surgeon: Malissa Hippo, MD;  [facility];  Service: Endoscopy;  Laterality: N/A;  7:30   COLONOSCOPY WITH PROPOFOL N/A 01/16/2022   Procedure: COLONOSCOPY WITH PROPOFOL;  Surgeon: Dolores Frame, MD;  [facility];  Service: Gastroenterology;  Laterality: N/A;  730 ASA 2   POLYPECTOMY  06/08/2018   Procedure: POLYPECTOMY;  Surgeon: Malissa Hippo, MD;  [facility];  Service: Endoscopy;;  cold snare [place] x 1, DC x1   POLYPECTOMY  01/16/2022   Procedure: POLYPECTOMY;  Surgeon: Dolores Frame, MD;  [facility];  Service: Gastroenterology;;    Current  Outpatient Medications  Medication Sig Dispense Refill   dicyclomine (BENTYL) 10 MG capsule TAKE 1 CAPSULE BY MOUTH 2 TIMES DAILY AS NEEDED FOR SPASMS. 60 capsule 3   ondansetron (ZOFRAN) 4 MG tablet Take 1 tablet (4 mg total) by mouth every 8 (eight) hours as needed for nausea or vomiting. 30 tablet 3   rosuvastatin  (CRESTOR) 20 MG tablet Take 1 tablet (20 mg total) by mouth daily. 90 tablet 3   aspirin EC (ADULT ASPIRIN REGIMEN) 81 MG tablet Take 81 mg by mouth once. (Patient not taking: Reported on 11/25/2022)     No current facility-administered medications for this visit.    Allergies as of 11/25/2022 - Review Complete 11/25/2022  Allergen Reaction Noted   Penicillins Rash 04/03/2018    Family History  Problem Relation Age of Onset   Breast cancer Niece     Social History   Socioeconomic History   Marital status: Married    Spouse name: Not on file   Number of children: Not on file   Years of education: Not on file   Highest education level: 12th grade  Occupational History   Not on file  Tobacco Use   Smoking status: Never    Passive exposure: Past   Smokeless tobacco: Never  Vaping Use   Vaping Use: Never used  Substance and Sexual Activity   Alcohol use: Never   Drug use: Never   Sexual activity: Not Currently    Birth control/protection: None  Other Topics Concern   Not on file  Social History Narrative   Are you right handed or left handed? Right    Are you currently employed ? Retired    What is your current occupation?   Do you live at home alone? No    Who lives with you? Family    What type of home do you live in: 1 story or 2 story?  Steps to basement        Social Determinants of Health   Financial Resource Strain: Low Risk  (09/19/2022)   Overall Financial Resource Strain (CARDIA)    Difficulty of Paying Living Expenses: Not hard at all  Food Insecurity: No Food Insecurity (09/19/2022)   Hunger Vital Sign    Worried About Running Out of Food in the Last Year: Never true    Ran Out of Food in the Last Year: Never true  Transportation Needs: Unmet Transportation Needs (09/19/2022)   PRAPARE - Administrator, Civil Service (Medical): Yes    Lack of Transportation (Non-Medical): No  Physical Activity: Insufficiently Active (09/19/2022)   Exercise Vital  Sign    Days of Exercise per Week: 1 day    Minutes of Exercise per Session: 20 min  Stress: No Stress Concern Present (09/19/2022)   [employer] of Occupational Health - Occupational Stress Questionnaire    Feeling of Stress : Only a little  Social Connections: Socially Integrated (09/19/2022)   Social Connection and Isolation Panel [NHANES]    Frequency of Communication with Friends and Family: More than three times a week    Frequency of Social Gatherings with Friends and Family: Twice a week    Attends Religious Services: More than 4 times per year    Active Member of [employer] or Organizations: Yes    Attends Engineer, structural: More than 4 times per year    Marital Status: Married    Review of systems General: negative for malaise, night sweats, fever, chills, weight  loss Neck: Negative for lumps, goiter, pain and significant neck swelling Resp: Negative for cough, wheezing, dyspnea at rest CV: Negative for chest pain, leg swelling, orthopnea +palpitations/chest pressure  GI: denies melena, hematochezia, nausea, vomiting, diarrhea, constipation, dysphagia, odyonophagia, early satiety or unintentional weight loss.  MSK: Negative for joint pain or swelling, back pain, and muscle pain. Derm: Negative for itching or rash Psych: Denies depression, anxiety, memory loss, confusion. No homicidal or suicidal ideation.  Heme: Negative for prolonged bleeding, bruising easily, and swollen nodes. Endocrine: Negative for cold or heat intolerance, polyuria, polydipsia and goiter. Neuro: negative for tremor, gait imbalance, syncope and seizures. The remainder of the review of systems is noncontributory.  Physical Exam: BP (!) 169/90 (BP Location: Left Arm, Patient Position: Sitting, Cuff Size: Normal)   Pulse 82   Temp 98.1 F (36.7 C) (Oral)   Ht 5\' 2"  (1.575 m)   Wt 163 lb 4.8 oz (74.1 kg)   BMI 29.87 kg/m  General:   Alert and oriented. No distress noted. Pleasant and  cooperative.  Head:  Normocephalic and atraumatic. Eyes:  Conjuctiva clear without scleral icterus. Mouth:  Oral mucosa pink and moist. Good dentition. No lesions. Heart: Normal rate and rhythm, s1 and s2 heart sounds present.  Lungs: Clear lung sounds in all lobes. Respirations equal and unlabored. Abdomen:  +BS, soft, non-tender and non-distended. No rebound or guarding. No HSM or masses noted. Derm: No palmar erythema or jaundice Msk:  Symmetrical without gross deformities. Normal posture. Extremities:  Without edema. Neurologic:  Alert and  oriented x4 Psych:  Alert and cooperative. Normal mood and affect.  Invalid input(s): "6 MONTHS"   ASSESSMENT: Nisa Crear is a 72 y.o. female presenting today for palpitations/chest pressure with eating  Patient with evidence of recent stroke in early May, she notes onset of palpitations and chest pressure around that time as well.  Palpitations and chest pressure tend to come on around the same time, can symptoms be elicited by eating or laying down.  She denies any GERD symptoms, nausea, vomiting, odynophagia or dysphagia. She has taken Pepto chewables at time of chest pressure with some relief on occasion.  Notably she had Holter monitor that she wore in June showing runs of SVT with heart rate as high as 240 bpm.  She is seeing cardiology in early August.  It is unclear at this time if she may be having some underlying GERD that could be contributing to chest pressure, however given palpitations and recent abnormal Holter monitor findings, this certainly raises concern for cardiac etiology of her symptoms.  Would recommend she see cardiology prior to any further GI evaluations (EGD).  If cardiology does not feel her symptoms are solely cardiac related, can consider further evaluation via EGD. For Now can start low-dose PPI to see if this helps with her chest pressure, this may also help her to tolerate her baby aspirin better as she notes she stopped  this due to stomach discomfort when taking it.  As she was recommended to be on baby aspirin by neurology due to recent stroke, I did encourage her to restart this.     PLAN:  Keep appt with cardiology  2. Can consider EGD if cardiac etiology as cause of her chest pressure is ruled out  3.  Start protonix 40mg  daily 4. Restart baby ASA as directed by neurology   All questions were answered, patient verbalized understanding and is in agreement with plan as outlined above.   Follow Up:  3 months   Vartan Kerins L. Kajal Scalici, MSN, APRN, AGNP-C Adult-Gerontology Nurse Practitioner [facility]  I have reviewed the note and agree with the APP's assessment as described in this progress note  If cardiac condition is ruled out, will need formal cardiac clearance prior to EGD given tachycardia episodes in Holter.  Katrinka Blazing, MD Gastroenterology and Hepatology [facility]astroenterology

## 2022-11-25 NOTE — Patient Instructions (Signed)
I am sending protonix 40mg  daily to start, this will act as a protective agent so you can restart your aspirin and we can see if this helps with your chest pressure, as discussed I want you to see cardiology to rule out that symptoms are cardiac related especially given your high heart rates on recent cardiac monitoring. Let me know once you have seen them and we can discuss further if they do not feel that symptoms are all cardiac related  Follow up 3 months  It was a pleasure to see you today. I want to create trusting relationships with patients and provide genuine, compassionate, and quality care. I truly value your feedback! please be on the lookout for a survey regarding your visit with me today. I appreciate your input about our visit and your time in completing this!    Aleph Nickson L. Jeanmarie Hubert, MSN, APRN, AGNP-C Adult-Gerontology Nurse Practitioner [facility] Gastroenterology at [facility]

## 2022-11-25 NOTE — Progress Notes (Deleted)
Patient notes that she had a stroke on May 1st.  She has noted some palpitations with pain into her left chest. She notes that laying down and eating will both bring on palpitations and chest pain. She feels that both palpitations and chest pain come on about the same time. She denies current GERD symptoms. She denies pain in her stomach. She notes somewhat of a pressure in her mid chest/epigastric area noting this 'feels like I swallowed an egg" this sensation can happen anytime but worse when she has palpitations. She denies dysphagia or odynophagia. She notes she did the low FODMAP diet previously recommended and had no further episodes of vomiting or abdominal cramping. She is seeing cardiology in August. She notes appetite is good. She feels hungry all the time. No weight loss. She is supposed to be on a baby aspirin, but notes that she feels it causes burning in her stomach.

## 2022-11-27 ENCOUNTER — Ambulatory Visit: Payer: Medicare HMO | Attending: Internal Medicine | Admitting: Internal Medicine

## 2022-11-27 ENCOUNTER — Encounter: Payer: Self-pay | Admitting: Internal Medicine

## 2022-11-27 VITALS — BP 130/70 | HR 76 | Ht 62.0 in | Wt 164.4 lb

## 2022-11-27 DIAGNOSIS — I4729 Other ventricular tachycardia: Secondary | ICD-10-CM | POA: Diagnosis not present

## 2022-11-27 MED ORDER — METOPROLOL SUCCINATE ER 25 MG PO TB24: 25.0000 mg | ORAL_TABLET | Freq: Every day | ORAL | 2 refills | Status: DC

## 2022-11-27 NOTE — Progress Notes (Signed)
Cardiology Office Note  Date: 11/27/2022   ID: Paige Cole, DOB 07/08/1950, MRN [id number]  PCP:  Tommie Sams, DO  Cardiologist:  Marjo Bicker, MD Electrophysiologist:  None   Reason for Office Visit: Evaluation of abnormal event monitor   History of Present Illness: Paige Cole is a 72 y.o. female known to have history of TIA/CVA (follows with neurology, unremarkable brain imaging), HLD was referred to cardiology clinic for evaluation of abnormal event monitor.  Patient underwent event monitor in light of possible TIA in 5/24 that revealed no evidence of atrial arrhythmias.  However there were 2 episodes of nonsustained V. tach the longest lasting 5 beats for which patient was referred to cardiology clinic.  She reports having pounding sensation and sometimes palpitations occurring especially at nighttime.  No dizziness, syncope.  She also has chest tightness especially when she lays on her back and also after eating a meal.  No chest pain with exertion.  No DOE, leg swelling, PND or orthopnea.  Denies smoking cigarettes.   Past Surgical History:  Procedure Laterality Date   ABDOMINAL HYSTERECTOMY     COLONOSCOPY N/A 06/08/2018   Procedure: COLONOSCOPY;  Surgeon: Malissa Hippo, MD;  [facility];  Service: Endoscopy;  Laterality: N/A;  7:30   COLONOSCOPY WITH PROPOFOL N/A 01/16/2022   Procedure: COLONOSCOPY WITH PROPOFOL;  Surgeon: Dolores Frame, MD;  [facility];  Service: Gastroenterology;  Laterality: N/A;  730 ASA 2   POLYPECTOMY  06/08/2018   Procedure: POLYPECTOMY;  Surgeon: Malissa Hippo, MD;  [facility];  Service: Endoscopy;;  cold snare [place] x 1, DC x1   POLYPECTOMY  01/16/2022   Procedure: POLYPECTOMY;  Surgeon: Dolores Frame, MD;  [facility];  Service: Gastroenterology;;    Current Outpatient Medications  Medication Sig Dispense Refill   aspirin EC (ADULT ASPIRIN REGIMEN) 81 MG tablet  Take 81 mg by mouth once.     dicyclomine (BENTYL) 10 MG capsule TAKE 1 CAPSULE BY MOUTH 2 TIMES DAILY AS NEEDED FOR SPASMS. 60 capsule 3   metoprolol succinate (TOPROL XL) 25 MG 24 hr tablet Take 1 tablet (25 mg total) by mouth daily. 90 tablet 2   ondansetron (ZOFRAN) 4 MG tablet Take 1 tablet (4 mg total) by mouth every 8 (eight) hours as needed for nausea or vomiting. 30 tablet 3   pantoprazole (PROTONIX) 40 MG tablet Take 1 tablet (40 mg total) by mouth daily. 60 tablet 2   rosuvastatin (CRESTOR) 20 MG tablet Take 1 tablet (20 mg total) by mouth daily. 90 tablet 3   No current facility-administered medications for this visit.   Allergies:  Penicillins   Social History: The patient  reports that she has never smoked. She has been exposed to tobacco smoke. She has never used smokeless tobacco. She reports that she does not drink alcohol and does not use drugs.   Family History: The patient's family history includes Breast cancer in her niece; Congestive Heart Failure in her father.   ROS:  Please see the history of present illness. Otherwise, complete review of systems is positive for none  All other systems are reviewed and negative.   Physical Exam: VS:  BP 130/70   Pulse 76   Ht 5\' 2"  (1.575 m)   Wt 164 lb 6.4 oz (74.6 kg)   SpO2 98%   BMI 30.07 kg/m , BMI Body mass index is 30.07 kg/m.  Wt Readings from  Last 3 Encounters:  11/27/22 164 lb 6.4 oz (74.6 kg)  11/25/22 163 lb 4.8 oz (74.1 kg)  09/30/22 162 lb 3.2 oz (73.6 kg)    General: Patient appears comfortable at rest. HEENT: Conjunctiva and lids normal, oropharynx clear with moist mucosa. Neck: Supple, no elevated JVP or carotid bruits, no thyromegaly. Lungs: Clear to auscultation, nonlabored breathing at rest. Cardiac: Regular rate and rhythm, no S3 or significant systolic murmur, no pericardial rub. Abdomen: Soft, nontender, no hepatomegaly, bowel sounds present, no guarding or rebound. Extremities: No pitting edema,  distal pulses 2+. Skin: Warm and dry. Musculoskeletal: No kyphosis. Neuropsychiatric: Alert and oriented x3, affect grossly appropriate.  Recent Labwork: 11/28/2021: Magnesium 2.1 09/23/2022: ALT 14; AST 14; BUN 17; Creatinine, Ser 0.75; Hemoglobin 13.8; Platelets 243; Potassium 4.4; Sodium 141; TSH 2.920     Component Value Date/Time   CHOL 230 (H) 09/23/2022 0812   TRIG 124 09/23/2022 0812   HDL 50 09/23/2022 0812   CHOLHDL 4.6 (H) 09/23/2022 0812   LDLCALC 158 (H) 09/23/2022 0812     Assessment and Plan:  # Noncardiac chest pain -Patient has chest tightness after eating a meal and after she lays down. No chest discomfort with exertion. Follow with PCP or GI for evaluation of GERD.  # NSVT -Event monitor was performed in 5/24 for evaluation of CVA that showed 2 runs of NSVT with longest lasting 5 beats. Start metoprolol succinate 25 mg once daily. Keep K>4 and <5, Mg>2 and <3. I discussed the importance of electrolyte repletion.  # History of CVA -Continue aspirin 81 mg once daily and high intensity statin, rosuvastatin 20 mg nightly.  # HLD, not at goal -Continue rosuvastatin 40 mg nightly, goal LDL less than 70.  Follow with PCP.  I have spent a total of 45 minutes with patient reviewing chart, EKGs, labs and examining patient as well as establishing an assessment and plan that was discussed with the patient.  > 50% of time was spent in direct patient care.    Medication Adjustments/Labs and Tests Ordered: Current medicines are reviewed at length with the patient today.  Concerns regarding medicines are outlined above.   Tests Ordered: No orders of the defined types were placed in this encounter.   Medication Changes: Meds ordered this encounter  Medications   metoprolol succinate (TOPROL XL) 25 MG 24 hr tablet    Sig: Take 1 tablet (25 mg total) by mouth daily.    Dispense:  90 tablet    Refill:  2    11/27/2022-New    Disposition:  Follow up  as  needed  Signed Takila Kronberg Verne Spurr, MD, 11/27/2022 11:36 AM    [facility] at [facility] [address] [place], [place], [place] [address]

## 2022-11-27 NOTE — Patient Instructions (Signed)
Medication Instructions:  Your physician has recommended you make the following change in your medication:  Start taking Metoprolol Succinate 25 mg once a day Continue taking all other medications as prescribed  Labwork: None  Testing/Procedures: None  Follow-Up: Your physician recommends that you schedule a follow-up appointment in: As needed  Any Other Special Instructions Will Be Listed Below (If Applicable).  If you need a refill on your cardiac medications before your next appointment, please call your pharmacy.

## 2022-12-04 ENCOUNTER — Encounter: Payer: Self-pay | Admitting: Neurology

## 2022-12-30 ENCOUNTER — Ambulatory Visit: Payer: Medicare HMO | Admitting: Neurology

## 2022-12-31 ENCOUNTER — Ambulatory Visit
Admission: RE | Admit: 2022-12-31 | Discharge: 2022-12-31 | Disposition: A | Discharge status: Home / Self Care | Payer: Medicare HMO | Source: Ambulatory Visit | Attending: Neurology | Admitting: Neurology

## 2022-12-31 DIAGNOSIS — R9082 White matter disease, unspecified: Secondary | ICD-10-CM | POA: Diagnosis not present

## 2022-12-31 DIAGNOSIS — I639 Cerebral infarction, unspecified: Secondary | ICD-10-CM

## 2022-12-31 DIAGNOSIS — R29818 Other symptoms and signs involving the nervous system: Secondary | ICD-10-CM

## 2022-12-31 MED ORDER — GADOPICLENOL 0.5 MMOL/ML IV SOLN
7.0000 mL | Freq: Once | INTRAVENOUS | Status: AC | PRN
  Administered 2022-12-31: 7 mL via INTRAVENOUS

## 2023-01-03 ENCOUNTER — Ambulatory Visit: Payer: Medicare HMO | Admitting: Internal Medicine

## 2023-01-21 ENCOUNTER — Encounter: Payer: Self-pay | Admitting: Neurology

## 2023-01-21 ENCOUNTER — Ambulatory Visit: Payer: Medicare HMO | Admitting: Neurology

## 2023-01-21 VITALS — BP 157/75 | HR 77 | Ht 62.0 in | Wt 166.2 lb

## 2023-01-21 DIAGNOSIS — R29818 Other symptoms and signs involving the nervous system: Secondary | ICD-10-CM

## 2023-01-21 DIAGNOSIS — I639 Cerebral infarction, unspecified: Secondary | ICD-10-CM | POA: Diagnosis not present

## 2023-01-21 NOTE — Progress Notes (Signed)
NEUROLOGY FOLLOW UP OFFICE NOTE  Paige Cole [id number] [birth year]  HISTORY OF PRESENT ILLNESS: I had the pleasure of seeing Paige Cole in follow-up in the neurology clinic on 01/21/2023.  The patient was last seen 4 months ago for stroke. She had a transient episode of alexia on 09/18/22, no speech changes noted. MRI brain showed a 9mm focus of restricted diffusion and T2 FLAIR hyperintense signal abnormality within the medial left temporal (with involvement of the hippocampus). There was also a 6mm T2 hyperintense parenchymal focus along the anteroinferior aspect of the third ventricle on the left, with minimal surrounding FLAIR hyperintensity, possibly a prominent perivascular space. I personally reviewed repeat brain MRI with and without contrast done 12/2022 with no acute changes, resolution of previously seen in signal in the left hippocampus. The hypothalamic finding on the left showed CSF intensity region without enhancement or enlargement, etiology indeterminate but likely benign. She had a normal 1-hour EEG in 09/2022. She reported dizzy spells with heart racing and fluttering. Her holter monitor did not show any arrhythmia, there were 2 episodes of nonsustained V. tach the longest lasting 5 beats, she was started on Metoprolol. She was having episodes with a sensation like an electrical shock going through her head on and off for 6 weeks, mostly at night, with associated palpitations. She started being more careful with her diet and started Pantoprazole, noticing these symptoms got better. The dizziness went away, as well as the palpitations. She was having a bad headache a few days after Metoprolol that resolved. She has a history of headaches usually in the temples, but with these headaches she was having pain on the top of her head and back of her neck radiating up. She has neck pain and uses ice packs and Tylenol. These symptoms resolved after stopping Metoprolol.    History on Initial  Assessment 09/30/2022: This is a very pleasant 72 year old right-handed woman with a history of diverticulitis, presenting for evaluation of stroke. She was in her usual state of health until 09/18/22, she was feeling fine and just finished reading her bible when she picked up her phone to look at the calendar for a birthday. She states she "could not make heads or tails" of it, she was looking at a name/letters, but read some other name. It did not look real, feeling dreamlike when she was looking at the calendar. She could not think of names of her extended family. Her daughter called and talked to her, with no speech changes noted. Her husband's phone went off with a text message, she could not think of the password but knew how to look it up. It lasted 15 minutes. She felt normal mentally after but did feel tired and took 2 naps. She felt completely fine after the second nap. There was no associated headache, dizziness, focal numbness/tingling/weakness. No further similar episodes, but since then she has had episodes of feeling lightheaded while sitting. She would feel it for 1-2 seconds after looking up. She feels better when she is up and moving around. She has had pings/brief sharp pains on either side of her head.She has been having palpitations off and on, and was having them a lot after the incident, with heavy pain in the mid-chest. She has not had them in the past 2 days.  She denies any staring/unresponsive episodes, gaps in time, olfactory/gustatory hallucinations, deja vu, rising epigastric sensation, focal numbness/tingling/weakness, myoclonic jerks. She has a history of headaches for several years that got better  after she retired 5 years ago, none recently. They mostly occur when she needs caffeine, there is some nausea, no photo/phonophobia. She denies any diplopia, dysarthria/dysphagia, neck/back pain, bowel/bladder dysfunction. She gets 8 hours of sleep. No alcohol or tobacco use. She had a  normal birth and early development.  There is no history of febrile convulsions, CNS infections such as meningitis/encephalitis, significant traumatic brain injury, neurosurgical procedures, or family history of seizures.  I personally reviewed brain MRI without contrast done 09/23/22 which showed a 9mm focus of restricted diffusion and T2 FLAIR hyperintense signal abnormality within the medial left temporal (with involvement of the hippocampus). There was also a 6mm T2 hyperintense parenchymal focus along the anteroinferior aspect of the third ventricle on the left, with minimal surrounding FLAIR hyperintensity, possibly a prominent perivascular space but interval MRI was recommended. Echocardiogram showed EF 55-60%, normal left atrium. CTA head and neck no significant stenosis. She was started on aspirin 81mg  daily. She had a lipid panel with LDL of 158. HbA1c 5.8. BP today 155/72, she notes at home it runs in 133/70-80s range.   Lab Results  Component Value Date   CHOL 230 (H) 09/23/2022   HDL 50 09/23/2022   LDLCALC 158 (H) 09/23/2022   TRIG 124 09/23/2022   CHOLHDL 4.6 (H) 09/23/2022   Lab Results  Component Value Date   HGBA1C 5.8 (H) 09/23/2022     PAST MEDICAL HISTORY: History reviewed. No pertinent past medical history.  MEDICATIONS: Current Outpatient Medications on File Prior to Visit  Medication Sig Dispense Refill   aspirin EC (ADULT ASPIRIN REGIMEN) 81 MG tablet Take 81 mg by mouth once.     pantoprazole (PROTONIX) 40 MG tablet Take 1 tablet (40 mg total) by mouth daily. 60 tablet 2   No current facility-administered medications on file prior to visit.    ALLERGIES: Allergies  Allergen Reactions   Penicillins Rash    Has patient had a PCN reaction causing immediate rash, facial/tongue/throat swelling, SOB or lightheadedness with hypotension: No Has patient had a PCN reaction causing severe rash involving mucus membranes or skin necrosis: No Has patient had a PCN  reaction that required hospitalization: No Has patient had a PCN reaction occurring within the last 10 years: No If all of the above answers are "NO", then may proceed with Cephalosporin use.     FAMILY HISTORY: Family History  Problem Relation Age of Onset   Congestive Heart Failure Father    Breast cancer Niece     SOCIAL HISTORY: Social History   Socioeconomic History   Marital status: Married    Spouse name: Not on file   Number of children: Not on file   Years of education: Not on file   Highest education level: 12th grade  Occupational History   Not on file  Tobacco Use   Smoking status: Never    Passive exposure: Past   Smokeless tobacco: Never  Vaping Use   Vaping status: Never Used  Substance and Sexual Activity   Alcohol use: Never   Drug use: Never   Sexual activity: Not Currently    Birth control/protection: None  Other Topics Concern   Not on file  Social History Narrative   Are you right handed or left handed? Right    Are you currently employed ? Retired    What is your current occupation?   Do you live at home alone? No    Who lives with you? Family    What type of  home do you live in: 1 story or 2 story?  Steps to basement        Social Determinants of Health   Financial Resource Strain: Low Risk  (09/19/2022)   Overall Financial Resource Strain (CARDIA)    Difficulty of Paying Living Expenses: Not hard at all  Food Insecurity: No Food Insecurity (09/19/2022)   Hunger Vital Sign    Worried About Running Out of Food in the Last Year: Never true    Ran Out of Food in the Last Year: Never true  Transportation Needs: Unmet Transportation Needs (09/19/2022)   PRAPARE - Administrator, Civil Service (Medical): Yes    Lack of Transportation (Non-Medical): No  Physical Activity: Insufficiently Active (09/19/2022)   Exercise Vital Sign    Days of Exercise per Week: 1 day    Minutes of Exercise per Session: 20 min  Stress: No Stress Concern  Present (09/19/2022)   [employer] of Occupational Health - Occupational Stress Questionnaire    Feeling of Stress : Only a little  Social Connections: Socially Integrated (09/19/2022)   Social Connection and Isolation Panel [NHANES]    Frequency of Communication with Friends and Family: More than three times a week    Frequency of Social Gatherings with Friends and Family: Twice a week    Attends Religious Services: More than 4 times per year    Active Member of [employer] or Organizations: Yes    Attends Engineer, structural: More than 4 times per year    Marital Status: Married  Catering manager Violence: Not on file     PHYSICAL EXAM: Vitals:   01/21/23 1448  BP: (!) 157/75  Pulse: 77  SpO2: 97%   General: No acute distress Head:  Normocephalic/atraumatic Skin/Extremities: No rash, no edema Neurological Exam: alert and awake. No aphasia or dysarthria. Fund of knowledge is appropriate.  Attention and concentration are normal.   Cranial nerves: Pupils equal, round. Extraocular movements intact with no nystagmus. Visual fields full.  No facial asymmetry.  Motor: Bulk and tone normal, muscle strength 5/5 throughout with no pronator drift.   Finger to nose testing intact.  Gait narrow-based and steady, no ataxia. No tremors.    IMPRESSION: This is a very pleasant 72 yo RH woman with a history of diverticulitis who had a transient 15-minute episode on of confusion where she read a different name on her phone and could not recall her husband's phone code on 09/18/22. Brain MRI on 09/23/22 showed 9mm focus of restricted diffusion and T2 FLAIR hyperintense signal abnormality within the medial left temporal (with involvement of the hippocampus). Repeat brain MRI with and without contrast done 12/2022 no acute changes, resolution of previously seen in signal in the left hippocampus. Etiology unclear, infarct (although no residual FLAIR changes on repeat imaging) versus seizure. EEG normal. No  further similar symptoms since 09/2022. She was having dizziness, palpitations, headaches, that appears to have resolved with GERD treatment. The hypothalamic finding on the left showed CSF intensity region without enhancement or enlargement, etiology indeterminate but likely benign. Continue daily aspirin and control of vascular risk factors for secondary stroke prevention, goal LDL <70. BP today 157/75, continue to monitor. Follow-up in 6 months, call for any changes.     Thank you for allowing me to participate in her care.  Please do not hesitate to call for any questions or concerns.    Patrcia Dolly, M.D.   CC: Dr. Adriana Simas

## 2023-01-21 NOTE — Patient Instructions (Signed)
Good to see you doing well. Continue to monitor symptoms. Continue to monitor BP at home. Follow-up in 6 months, call for any changes.

## 2023-03-06 ENCOUNTER — Encounter (INDEPENDENT_AMBULATORY_CARE_PROVIDER_SITE_OTHER): Payer: Self-pay

## 2023-03-06 ENCOUNTER — Encounter (INDEPENDENT_AMBULATORY_CARE_PROVIDER_SITE_OTHER): Payer: Self-pay | Admitting: Gastroenterology

## 2023-03-06 ENCOUNTER — Ambulatory Visit (INDEPENDENT_AMBULATORY_CARE_PROVIDER_SITE_OTHER): Payer: Medicare HMO | Admitting: Gastroenterology

## 2023-03-06 VITALS — BP 153/79 | HR 69 | Temp 97.1°F | Ht 62.0 in | Wt 163.4 lb

## 2023-03-06 DIAGNOSIS — K582 Mixed irritable bowel syndrome: Secondary | ICD-10-CM

## 2023-03-06 DIAGNOSIS — R079 Chest pain, unspecified: Secondary | ICD-10-CM | POA: Diagnosis not present

## 2023-03-06 DIAGNOSIS — K219 Gastro-esophageal reflux disease without esophagitis: Secondary | ICD-10-CM | POA: Insufficient documentation

## 2023-03-06 NOTE — H&P (View-Only) (Signed)
Katrinka Blazing, M.D. Gastroenterology & Hepatology [facility] [address] [place], [place] [address]  Primary Care Physician: Tommie Sams, DO [address] [place] [address]  I will communicate my assessment and recommendations to the referring MD via EMR.  Problems: Chest pain, possibly GERD related GERD  History of Present Illness: Paige Cole is a 72 y.o. female with PMH diveritculitis, stroke, IBS and GERD, who presents for follow up of chest pain.  The patient was last seen on 11/25/2022. At that time, the patient was started on Protonix 40 mg every day.  She was seen by cardiology on 11/27/2022 who considered that the chest tightness was noncardiac.  Patient reports that she is feeling much better. States that she has felt her chest discomfort is better compared to prior. She reports that she was having palpitations, which are not happening anymore. States very rarely she is presenting chest discomfort if she has a heavy meal and if she eats spicy food, she will have some mild discomfort in her chest.  She takes Protonix 40 mg qday. No dysphagia or odynophagia.  Very occasionally has regurgitation of food coming to her chest when she bends down.  She is still having "brain saps" prior to going to sleep.  The patient states that these episodes coincided with her chest discomfort in the past, although right now they are happening without the chest discomfort.  Very rarely she has episodes of abdominal cramping.  She states that she has some constipation issues occasionally, which she manages by eating apples or raw carrots.  The patient denies having any nausea, vomiting, fever, chills, hematochezia, melena, hematemesis, abdominal distention, diarrhea, jaundice, pruritus or weight loss.  Last EGD: never Last Colonoscopy:  - Three 4 to 6 mm polyps in the sigmoid colon and in the transverse colon-3 tubular adenomas -  Diverticulosis in the sigmoid colon, in the descending colon and in the ascending colon. - The distal rectum and anal verge are normal on retroflexion view.  Repeat in 5 years   Past Medical History:History reviewed. No pertinent past medical history.  Past Surgical History: Past Surgical History:  Procedure Laterality Date   ABDOMINAL HYSTERECTOMY     COLONOSCOPY N/A 06/08/2018   Procedure: COLONOSCOPY;  Surgeon: Malissa Hippo, MD;  Location: [facility];  Service: Endoscopy;  Laterality: N/A;  7:30   COLONOSCOPY WITH PROPOFOL N/A 01/16/2022   Procedure: COLONOSCOPY WITH PROPOFOL;  Surgeon: Dolores Frame, MD;  Location: [facility];  Service: Gastroenterology;  Laterality: N/A;  730 ASA 2   POLYPECTOMY  06/08/2018   Procedure: POLYPECTOMY;  Surgeon: Malissa Hippo, MD;  Location: [facility];  Service: Endoscopy;;  cold snare Sp[place]ding x 1, DC x1   POLYPECTOMY  01/16/2022   Procedure: POLYPECTOMY;  Surgeon: Dolores Frame, MD;  Location: [facility];  Service: Gastroenterology;;    Family History: Family History  Problem Relation Age of Onset   Congestive Heart Failure Father    Breast cancer Niece     Social History: Social History   Tobacco Use  Smoking Status Never   Passive exposure: Past  Smokeless Tobacco Never   Social History   Substance and Sexual Activity  Alcohol Use Never   Social History   Substance and Sexual Activity  Drug Use Never    Allergies: Allergies  Allergen Reactions   Penicillins Rash    Has patient had a PCN reaction causing immediate rash,  facial/tongue/throat swelling, SOB or lightheadedness with hypotension: No Has patient had a PCN reaction causing severe rash involving mucus membranes or skin necrosis: No Has patient had a PCN reaction that required hospitalization: No Has patient had a PCN reaction occurring within the last 10 years: No If all of the above answers are "NO", then may proceed with  Cephalosporin use.     Medications: Current Outpatient Medications  Medication Sig Dispense Refill   aspirin EC (ADULT ASPIRIN REGIMEN) 81 MG tablet Take 81 mg by mouth once.     pantoprazole (PROTONIX) 40 MG tablet Take 1 tablet (40 mg total) by mouth daily. 60 tablet 2   No current facility-administered medications for this visit.    Review of Systems: GENERAL: negative for malaise, night sweats HEENT: No changes in hearing or vision, no nose bleeds or other nasal problems. NECK: Negative for lumps, goiter, pain and significant neck swelling RESPIRATORY: Negative for cough, wheezing CARDIOVASCULAR: Negative for chest pain, leg swelling, palpitations, orthopnea GI: SEE HPI MUSCULOSKELETAL: Negative for joint pain or swelling, back pain, and muscle pain. SKIN: Negative for lesions, rash PSYCH: Negative for sleep disturbance, mood disorder and recent psychosocial stressors. HEMATOLOGY Negative for prolonged bleeding, bruising easily, and swollen nodes. ENDOCRINE: Negative for cold or heat intolerance, polyuria, polydipsia and goiter. NEURO: negative for tremor, gait imbalance, syncope and seizures. The remainder of the review of systems is noncontributory.   Physical Exam: BP (!) 153/79 (BP Location: Left Arm, Patient Position: Sitting, Cuff Size: Large)   Pulse 69   Temp (!) 97.1 F (36.2 C) (Temporal)   Ht 5\' 2"  (1.575 m)   Wt 163 lb 6.4 oz (74.1 kg)   BMI 29.89 kg/m  GENERAL: The patient is AO x3, in no acute distress. HEENT: Head is normocephalic and atraumatic. EOMI are intact. Mouth is well hydrated and without lesions. NECK: Supple. No masses LUNGS: Clear to auscultation. No presence of rhonchi/wheezing/rales. Adequate chest expansion HEART: RRR, normal s1 and s2. ABDOMEN: Soft, nontender, no guarding, no peritoneal signs, and nondistended. BS +. No masses. EXTREMITIES: Without any cyanosis, clubbing, rash, lesions or edema. NEUROLOGIC: AOx3, no focal motor  deficit. SKIN: no jaundice, no rashes  Imaging/Labs: as above  I personally reviewed and interpreted the available labs, imaging and endoscopic files.  Impression and Plan: Paige Cole is a 72 y.o. female with PMH diveritculitis, stroke, IBS and GERD, who presents for follow up of chest pain.  The patient has presented improvement of her chest pain while taking medium dose PPI on a daily basis.  She denies having any complaints at the moment.  It is very likely her symptoms are related to GERD, for which she has to continue taking pantoprazole 40 mg every day.  I discussed with her that given her age and her atypical symptoms with presence of regurgitation, we will need to evaluate her upper gastrointestinal tract with an EGD, which she is agreeable to proceed with.  -Schedule EGD -Continue pantoprazole 40 mg every day  All questions were answered.      Katrinka Blazing, MD Gastroenterology and Hepatology [facility]

## 2023-03-06 NOTE — Patient Instructions (Signed)
Schedule EGD Continue pantoprazole 40 mg every day

## 2023-03-06 NOTE — Progress Notes (Signed)
Katrinka Blazing, M.D. Gastroenterology & Hepatology [facility] [address] [place], [place] [address]  Primary Care Physician: Tommie Sams, DO [address] [place] [address]  I will communicate my assessment and recommendations to the referring MD via EMR.  Problems: Chest pain, possibly GERD related GERD  History of Present Illness: Suriyah Thiam is a 72 y.o. female with PMH diveritculitis, stroke, IBS and GERD, who presents for follow up of chest pain.  The patient was last seen on 11/25/2022. At that time, the patient was started on Protonix 40 mg every day.  She was seen by cardiology on 11/27/2022 who considered that the chest tightness was noncardiac.  Patient reports that she is feeling much better. States that she has felt her chest discomfort is better compared to prior. She reports that she was having palpitations, which are not happening anymore. States very rarely she is presenting chest discomfort if she has a heavy meal and if she eats spicy food, she will have some mild discomfort in her chest.  She takes Protonix 40 mg qday. No dysphagia or odynophagia.  Very occasionally has regurgitation of food coming to her chest when she bends down.  She is still having "brain saps" prior to going to sleep.  The patient states that these episodes coincided with her chest discomfort in the past, although right now they are happening without the chest discomfort.  Very rarely she has episodes of abdominal cramping.  She states that she has some constipation issues occasionally, which she manages by eating apples or raw carrots.  The patient denies having any nausea, vomiting, fever, chills, hematochezia, melena, hematemesis, abdominal distention, diarrhea, jaundice, pruritus or weight loss.  Last EGD: never Last Colonoscopy:  - Three 4 to 6 mm polyps in the sigmoid colon and in the transverse colon-3 tubular adenomas -  Diverticulosis in the sigmoid colon, in the descending colon and in the ascending colon. - The distal rectum and anal verge are normal on retroflexion view.  Repeat in 5 years   Past Medical History:History reviewed. No pertinent past medical history.  Past Surgical History: Past Surgical History:  Procedure Laterality Date   ABDOMINAL HYSTERECTOMY     COLONOSCOPY N/A 06/08/2018   Procedure: COLONOSCOPY;  Surgeon: Malissa Hippo, MD;  [facility]: [facility];  Service: Endoscopy;  Laterality: N/A;  7:30   COLONOSCOPY WITH PROPOFOL N/A 01/16/2022   Procedure: COLONOSCOPY WITH PROPOFOL;  Surgeon: Dolores Frame, MD;  Location: [facility];  Service: Gastroenterology;  Laterality: N/A;  730 ASA 2   POLYPECTOMY  06/08/2018   Procedure: POLYPECTOMY;  Surgeon: Malissa Hippo, MD;  Location: [facility];  Service: Endoscopy;;  cold snare Sp[place]ding x 1, DC x1   POLYPECTOMY  01/16/2022   Procedure: POLYPECTOMY;  Surgeon: Dolores Frame, MD;  Location: [facility];  Service: Gastroenterology;;    Family History: Family History  Problem Relation Age of Onset   Congestive Heart Failure Father    Breast cancer Niece     Social History: Social History   Tobacco Use  Smoking Status Never   Passive exposure: Past  Smokeless Tobacco Never   Social History   Substance and Sexual Activity  Alcohol Use Never   Social History   Substance and Sexual Activity  Drug Use Never    Allergies: Allergies  Allergen Reactions   Penicillins Rash    Has patient had a PCN reaction causing immediate rash,  facial/tongue/throat swelling, SOB or lightheadedness with hypotension: No Has patient had a PCN reaction causing severe rash involving mucus membranes or skin necrosis: No Has patient had a PCN reaction that required hospitalization: No Has patient had a PCN reaction occurring within the last 10 years: No If all of the above answers are "NO", then may proceed with  Cephalosporin use.     Medications: Current Outpatient Medications  Medication Sig Dispense Refill   aspirin EC (ADULT ASPIRIN REGIMEN) 81 MG tablet Take 81 mg by mouth once.     pantoprazole (PROTONIX) 40 MG tablet Take 1 tablet (40 mg total) by mouth daily. 60 tablet 2   No current facility-administered medications for this visit.    Review of Systems: GENERAL: negative for malaise, night sweats HEENT: No changes in hearing or vision, no nose bleeds or other nasal problems. NECK: Negative for lumps, goiter, pain and significant neck swelling RESPIRATORY: Negative for cough, wheezing CARDIOVASCULAR: Negative for chest pain, leg swelling, palpitations, orthopnea GI: SEE HPI MUSCULOSKELETAL: Negative for joint pain or swelling, back pain, and muscle pain. SKIN: Negative for lesions, rash PSYCH: Negative for sleep disturbance, mood disorder and recent psychosocial stressors. HEMATOLOGY Negative for prolonged bleeding, bruising easily, and swollen nodes. ENDOCRINE: Negative for cold or heat intolerance, polyuria, polydipsia and goiter. NEURO: negative for tremor, gait imbalance, syncope and seizures. The remainder of the review of systems is noncontributory.   Physical Exam: BP (!) 153/79 (BP Location: Left Arm, Patient Position: Sitting, Cuff Size: Large)   Pulse 69   Temp (!) 97.1 F (36.2 C) (Temporal)   Ht 5\' 2"  (1.575 m)   Wt 163 lb 6.4 oz (74.1 kg)   BMI 29.89 kg/m  GENERAL: The patient is AO x3, in no acute distress. HEENT: Head is normocephalic and atraumatic. EOMI are intact. Mouth is well hydrated and without lesions. NECK: Supple. No masses LUNGS: Clear to auscultation. No presence of rhonchi/wheezing/rales. Adequate chest expansion HEART: RRR, normal s1 and s2. ABDOMEN: Soft, nontender, no guarding, no peritoneal signs, and nondistended. BS +. No masses. EXTREMITIES: Without any cyanosis, clubbing, rash, lesions or edema. NEUROLOGIC: AOx3, no focal motor  deficit. SKIN: no jaundice, no rashes  Imaging/Labs: as above  I personally reviewed and interpreted the available labs, imaging and endoscopic files.  Impression and Plan: Derionna Leming is a 72 y.o. female with PMH diveritculitis, stroke, IBS and GERD, who presents for follow up of chest pain.  The patient has presented improvement of her chest pain while taking medium dose PPI on a daily basis.  She denies having any complaints at the moment.  It is very likely her symptoms are related to GERD, for which she has to continue taking pantoprazole 40 mg every day.  I discussed with her that given her age and her atypical symptoms with presence of regurgitation, we will need to evaluate her upper gastrointestinal tract with an EGD, which she is agreeable to proceed with.  -Schedule EGD -Continue pantoprazole 40 mg every day  All questions were answered.      Katrinka Blazing, MD Gastroenterology and Hepatology [facility]

## 2023-03-10 ENCOUNTER — Ambulatory Visit (INDEPENDENT_AMBULATORY_CARE_PROVIDER_SITE_OTHER): Payer: Medicare HMO | Admitting: Gastroenterology

## 2023-03-26 ENCOUNTER — Ambulatory Visit (HOSPITAL_COMMUNITY)
Admission: RE | Admit: 2023-03-26 | Discharge: 2023-03-26 | Disposition: A | Discharge status: Home / Self Care | Payer: Medicare HMO | Attending: Gastroenterology | Admitting: Gastroenterology

## 2023-03-26 ENCOUNTER — Ambulatory Visit (HOSPITAL_COMMUNITY): Payer: Medicare HMO | Admitting: Anesthesiology

## 2023-03-26 ENCOUNTER — Other Ambulatory Visit: Payer: Self-pay

## 2023-03-26 ENCOUNTER — Encounter (HOSPITAL_COMMUNITY): Payer: Self-pay | Admitting: Gastroenterology

## 2023-03-26 ENCOUNTER — Encounter (HOSPITAL_COMMUNITY)
Admission: RE | Disposition: A | Discharge status: Home / Self Care | Payer: Self-pay | Source: Home / Self Care | Attending: Gastroenterology

## 2023-03-26 DIAGNOSIS — Z8673 Personal history of transient ischemic attack (TIA), and cerebral infarction without residual deficits: Secondary | ICD-10-CM | POA: Diagnosis not present

## 2023-03-26 DIAGNOSIS — R079 Chest pain, unspecified: Secondary | ICD-10-CM | POA: Insufficient documentation

## 2023-03-26 DIAGNOSIS — K219 Gastro-esophageal reflux disease without esophagitis: Secondary | ICD-10-CM | POA: Insufficient documentation

## 2023-03-26 DIAGNOSIS — Z79899 Other long term (current) drug therapy: Secondary | ICD-10-CM | POA: Diagnosis not present

## 2023-03-26 HISTORY — PX: ESOPHAGOGASTRODUODENOSCOPY (EGD) WITH PROPOFOL: SHX5813

## 2023-03-26 SURGERY — ESOPHAGOGASTRODUODENOSCOPY (EGD) WITH PROPOFOL
Anesthesia: General

## 2023-03-26 MED ORDER — LIDOCAINE HCL (CARDIAC) PF 100 MG/5ML IV SOSY
PREFILLED_SYRINGE | INTRAVENOUS | Status: DC | PRN
  Administered 2023-03-26: 50 mg via INTRAVENOUS

## 2023-03-26 MED ORDER — PROPOFOL 10 MG/ML IV BOLUS
INTRAVENOUS | Status: DC | PRN
  Administered 2023-03-26: 50 mg via INTRAVENOUS

## 2023-03-26 MED ORDER — SODIUM CHLORIDE 0.9% FLUSH: 10.0000 mL | Freq: Two times a day (BID) | INTRAVENOUS | Status: DC

## 2023-03-26 MED ORDER — PROPOFOL 500 MG/50ML IV EMUL
INTRAVENOUS | Status: DC | PRN
  Administered 2023-03-26: 200 ug/kg/min via INTRAVENOUS

## 2023-03-26 MED ORDER — LACTATED RINGERS IV SOLN: INTRAVENOUS | Status: DC | PRN

## 2023-03-26 NOTE — Anesthesia Preprocedure Evaluation (Signed)
Anesthesia Evaluation  Patient identified by MRN, date of birth, ID band Patient awake    Reviewed: Allergy & Precautions, H&P , NPO status , Patient's Chart, lab work & pertinent test results, reviewed documented beta blocker date and time   Airway Mallampati: II  TM Distance: >3 FB Neck ROM: full    Dental no notable dental hx.    Pulmonary neg pulmonary ROS   Pulmonary exam normal breath sounds clear to auscultation       Cardiovascular Exercise Tolerance: Good negative cardio ROS  Rhythm:regular Rate:Normal     Neuro/Psych CVA negative neurological ROS  negative psych ROS   GI/Hepatic negative GI ROS, Neg liver ROS,GERD  ,,  Endo/Other  negative endocrine ROS    Renal/GU negative Renal ROS  negative genitourinary   Musculoskeletal   Abdominal   Peds  Hematology negative hematology ROS (+)   Anesthesia Other Findings   Reproductive/Obstetrics negative OB ROS                             Anesthesia Physical Anesthesia Plan  ASA: 2  Anesthesia Plan: General   Post-op Pain Management:    Induction:   PONV Risk Score and Plan: Propofol infusion  Airway Management Planned:   Additional Equipment:   Intra-op Plan:   Post-operative Plan:   Informed Consent: I have reviewed the patients History and Physical, chart, labs and discussed the procedure including the risks, benefits and alternatives for the proposed anesthesia with the patient or authorized representative who has indicated his/her understanding and acceptance.     Dental Advisory Given  Plan Discussed with: CRNA  Anesthesia Plan Comments:        Anesthesia Quick Evaluation

## 2023-03-26 NOTE — Discharge Instructions (Signed)
You are being discharged to home.  Resume your previous diet.  Continue your present medications.  

## 2023-03-26 NOTE — Interval H&P Note (Signed)
History and Physical Interval Note:  03/26/2023 7:32 AM  Paige Cole  has presented today for surgery, with the diagnosis of GERD,CHEST PAIN.  The various methods of treatment have been discussed with the patient and family. After consideration of risks, benefits and other options for treatment, the patient has consented to  Procedure(s) with comments: ESOPHAGOGASTRODUODENOSCOPY (EGD) WITH PROPOFOL (N/A) - 8:45AM;ASA 1 as a surgical intervention.  The patient's history has been reviewed, patient examined, no change in status, stable for surgery.  I have reviewed the patient's chart and labs.  Questions were answered to the patient's satisfaction.     Katrinka Blazing [place]

## 2023-03-26 NOTE — Op Note (Signed)
[facility] Patient Name: Paige Cole Procedure Date: 03/26/2023 7:58 AM MRN: [id number] Date of Birth: 13-Feb-1951 Attending MD: Katrinka Blazing , , [id number] CSN: [id number] Age: 72 Admit Type: Outpatient Procedure:                Upper GI endoscopy Indications:              Gastro-esophageal reflux disease Providers:                Katrinka Blazing, Francoise Ceo RN, RN, Dyann Ruddle Referring MD:              Medicines:                Monitored Anesthesia Care Complications:            No immediate complications. Estimated Blood Loss:     Estimated blood loss: none. Procedure:                Pre-Anesthesia Assessment:                           - Prior to the procedure, a History and Physical                            was performed, and patient medications, allergies                            and sensitivities were reviewed. The patient's                            tolerance of previous anesthesia was reviewed.                           - The risks and benefits of the procedure and the                            sedation options and risks were discussed with the                            patient. All questions were answered and informed                            consent was obtained.                           - ASA Grade Assessment: II - A patient with mild                            systemic disease.                           After obtaining informed consent, the endoscope was                            passed under direct vision. Throughout the                            procedure, the patient's  blood pressure, pulse, and                            oxygen saturations were monitored continuously. The                            [id number]-[id number] ([id number]) scope was introduced through the                            mouth, and advanced to the second part of duodenum.                            The upper GI endoscopy was accomplished without                            difficulty.  The patient tolerated the procedure                            well. Scope In: 8:13:37 AM Scope Out: 8:15:57 AM Total Procedure Duration: 0 hours 2 minutes 20 seconds  Findings:      The esophagus was normal.      The gastroesophageal flap valve was visualized endoscopically and       classified as Hill Grade II (fold present, opens with respiration).      The stomach was normal.      The examined duodenum was normal. Impression:               - Normal esophagus.                           - Normal stomach.                           - Normal examined duodenum.                           - No specimens collected. Moderate Sedation:      Per Anesthesia Care Recommendation:           - Discharge patient to home (ambulatory).                           - Resume previous diet.                           - Continue present medications. Procedure Code(s):        --- Professional ---                           [phone], Esophagogastroduodenoscopy, flexible,                            transoral; diagnostic, including collection of                            specimen(s) by brushing or washing, when performed                            (  separate procedure) Diagnosis Code(s):        --- Professional ---                           K21.9, Gastro-esophageal reflux disease without                            esophagitis CPT copyright 2022 American Medical Association. All rights reserved. The codes documented in this report are preliminary and upon coder review may  be revised to meet current compliance requirements. Katrinka Blazing, MD Katrinka Blazing,  03/26/2023 8:22:07 AM This report has been signed electronically. Number of Addenda: 0

## 2023-03-26 NOTE — Transfer of Care (Signed)
Immediate Anesthesia Transfer of Care Note  Patient: Paige Cole  Procedure(s) Performed: ESOPHAGOGASTRODUODENOSCOPY (EGD) WITH PROPOFOL  Patient Location: Short Stay  Anesthesia Type:General  Level of Consciousness: awake, alert , oriented, and patient cooperative  Airway & Oxygen Therapy: Patient Spontanous Breathing  Post-op Assessment: Report given to RN, Post -op Vital signs reviewed and stable, and Patient moving all extremities X 4  Post vital signs: Reviewed and stable  Last Vitals:  Vitals Value Taken Time  BP 140/75 03/26/23 0820  Temp 36.6 C 03/26/23 0820  Pulse 84 03/26/23 0820  Resp 20 03/26/23 0820  SpO2 95 % 03/26/23 0820    Last Pain:  Vitals:   03/26/23 0820  TempSrc: Oral  PainSc: 0-No pain      Patients Stated Pain Goal: 8 (03/26/23 0723)  Complications: No notable events documented.

## 2023-03-27 NOTE — Anesthesia Postprocedure Evaluation (Signed)
Anesthesia Post Note  Patient: Paige Cole  Procedure(s) Performed: ESOPHAGOGASTRODUODENOSCOPY (EGD) WITH PROPOFOL  Patient location during evaluation: Phase II Anesthesia Type: General Level of consciousness: awake Pain management: pain level controlled Vital Signs Assessment: post-procedure vital signs reviewed and stable Respiratory status: spontaneous breathing and respiratory function stable Cardiovascular status: blood pressure returned to baseline and stable Postop Assessment: no headache and no apparent nausea or vomiting Anesthetic complications: no Comments: Late entry   No notable events documented.   Last Vitals:  Vitals:   03/26/23 0723 03/26/23 0820  BP: (!) 179/76 (!) 140/75  Pulse: 77 84  Resp: 16 20  Temp: 36.6 C 36.6 C  SpO2: 98% 95%    Last Pain:  Vitals:   03/26/23 0820  TempSrc: Oral  PainSc: 0-No pain                 Windell Norfolk

## 2023-04-01 ENCOUNTER — Encounter (HOSPITAL_COMMUNITY): Payer: Self-pay | Admitting: Gastroenterology

## 2023-05-18 ENCOUNTER — Other Ambulatory Visit (INDEPENDENT_AMBULATORY_CARE_PROVIDER_SITE_OTHER): Payer: Self-pay | Admitting: Gastroenterology

## 2023-07-08 DIAGNOSIS — H35372 Puckering of macula, left eye: Secondary | ICD-10-CM | POA: Diagnosis not present

## 2023-07-08 DIAGNOSIS — H43313 Vitreous membranes and strands, bilateral: Secondary | ICD-10-CM | POA: Diagnosis not present

## 2023-07-08 DIAGNOSIS — H25813 Combined forms of age-related cataract, bilateral: Secondary | ICD-10-CM | POA: Diagnosis not present

## 2023-07-08 DIAGNOSIS — H524 Presbyopia: Secondary | ICD-10-CM | POA: Diagnosis not present

## 2023-07-28 DIAGNOSIS — Z1283 Encounter for screening for malignant neoplasm of skin: Secondary | ICD-10-CM | POA: Diagnosis not present

## 2023-07-28 DIAGNOSIS — D225 Melanocytic nevi of trunk: Secondary | ICD-10-CM | POA: Diagnosis not present

## 2023-08-11 ENCOUNTER — Encounter: Payer: Self-pay | Admitting: Neurology

## 2023-08-11 ENCOUNTER — Ambulatory Visit: Payer: Medicare HMO | Admitting: Neurology

## 2023-08-11 VITALS — BP 154/81 | HR 77 | Ht 62.0 in | Wt 168.2 lb

## 2023-08-11 DIAGNOSIS — I639 Cerebral infarction, unspecified: Secondary | ICD-10-CM

## 2023-08-11 DIAGNOSIS — M542 Cervicalgia: Secondary | ICD-10-CM

## 2023-08-11 DIAGNOSIS — G4486 Cervicogenic headache: Secondary | ICD-10-CM

## 2023-08-11 NOTE — Patient Instructions (Addendum)
 Always good to see you.  Schedule cervical spine xray  2. Referral will be sent for Physical therapy for neck pain, cervicogenic headaches (ie headaches due to neck issues)  3. Continue daily aspirin, control of blood pressure, cholesterol, glucose levels  4. Follow-up in 4-5 months, call for any changes

## 2023-08-11 NOTE — Progress Notes (Signed)
 NEUROLOGY FOLLOW UP OFFICE NOTE  Paige Cole [id number] [birth year]  HISTORY OF PRESENT ILLNESS: I had the pleasure of seeing Paige Cole in follow-up in the neurology clinic on 08/11/2023.  The patient was last seen 6 months ago for stroke. She is alone in the office today. She had a transient episode of alexia on 09/18/22, no speech changes noted. MRI brain showed a 9mm focus of restricted diffusion and T2 FLAIR hyperintense signal abnormality within the medial left temporal (with involvement of the hippocampus). There was also a 6mm T2 hyperintense parenchymal focus along the anteroinferior aspect of the third ventricle on the left, with minimal surrounding FLAIR hyperintensity, possibly a prominent perivascular space. Repeat brain MRI with and without contrast 12/2022 with no acute changes, resolution of previously seen in signal in the left hippocampus. The hypothalamic finding on the left showed CSF intensity region without enhancement or enlargement, etiology indeterminate but likely benign. She had a normal 1-hour EEG in 09/2022.   On her last visit, she reported headaches from the back of her head, radiating to the vertex, as well as neck pain. She reports that since then, she has been having these on a daily basis, it starts in the base of her skull, there is a cramping feeling in her neck. No associated nausea/vomiting, vision changes, focal numbness/tingling/weakness. IIt occurs almost daily after lunch, lasting a few hours, 3 at the most. She notices pain starts when she is sitting or lying down with head partially bent forward. One time, the pain was so bad with no improvement with Tylenol. She found an exercise online where fingers are applied to the base of the skull and head is turned sideways, and pain completely went away. She denies any dizziness, every now and then she feels the slight "zap" at night when lying down. She has occasional ringing in the right ear, sometimes both ears feel  full or "buzzy." She has not seen ENT for this. The reflux symptoms and heart pounding have stopped. No falls. She gets 8 hours of sleep. She takes a daily aspirin, BP today 154/81 but at home BP is usually fine with highest in the 130s.    History on Initial Assessment 09/30/2022: This is a very pleasant 73 year old right-handed woman with a history of diverticulitis, presenting for evaluation of stroke. She was in her usual state of health until 09/18/22, she was feeling fine and just finished reading her bible when she picked up her phone to look at the calendar for a birthday. She states she "could not make heads or tails" of it, she was looking at a name/letters, but read some other name. It did not look real, feeling dreamlike when she was looking at the calendar. She could not think of names of her extended family. Her daughter called and talked to her, with no speech changes noted. Her husband's phone went off with a text message, she could not think of the password but knew how to look it up. It lasted 15 minutes. She felt normal mentally after but did feel tired and took 2 naps. She felt completely fine after the second nap. There was no associated headache, dizziness, focal numbness/tingling/weakness. No further similar episodes, but since then she has had episodes of feeling lightheaded while sitting. She would feel it for 1-2 seconds after looking up. She feels better when she is up and moving around. She has had pings/brief sharp pains on either side of her head.She has been having palpitations  off and on, and was having them a lot after the incident, with heavy pain in the mid-chest. She has not had them in the past 2 days.  She denies any staring/unresponsive episodes, gaps in time, olfactory/gustatory hallucinations, deja vu, rising epigastric sensation, focal numbness/tingling/weakness, myoclonic jerks. She has a history of headaches for several years that got better after she retired 5 years  ago, none recently. They mostly occur when she needs caffeine, there is some nausea, no photo/phonophobia. She denies any diplopia, dysarthria/dysphagia, neck/back pain, bowel/bladder dysfunction. She gets 8 hours of sleep. No alcohol or tobacco use. She had a normal birth and early development.  There is no history of febrile convulsions, CNS infections such as meningitis/encephalitis, significant traumatic brain injury, neurosurgical procedures, or family history of seizures.  I personally reviewed brain MRI without contrast done 09/23/22 which showed a 9mm focus of restricted diffusion and T2 FLAIR hyperintense signal abnormality within the medial left temporal (with involvement of the hippocampus). There was also a 6mm T2 hyperintense parenchymal focus along the anteroinferior aspect of the third ventricle on the left, with minimal surrounding FLAIR hyperintensity, possibly a prominent perivascular space but interval MRI was recommended. Echocardiogram showed EF 55-60%, normal left atrium. CTA head and neck no significant stenosis. She was started on aspirin 81mg  daily. She had a lipid panel with LDL of 158. HbA1c 5.8. BP today 155/72, she notes at home it runs in 133/70-80s range.   PAST MEDICAL HISTORY: No past medical history on file.  MEDICATIONS: Current Outpatient Medications on File Prior to Visit  Medication Sig Dispense Refill   aspirin EC (ADULT ASPIRIN REGIMEN) 81 MG tablet Take 81 mg by mouth once.     pantoprazole (PROTONIX) 40 MG tablet TAKE 1 TABLET BY MOUTH EVERY DAY (Patient taking differently: Take 40 mg by mouth as needed.) 60 tablet 2   No current facility-administered medications on file prior to visit.    ALLERGIES: Allergies  Allergen Reactions   Penicillins Rash    Has patient had a PCN reaction causing immediate rash, facial/tongue/throat swelling, SOB or lightheadedness with hypotension: No Has patient had a PCN reaction causing severe rash involving mucus membranes  or skin necrosis: No Has patient had a PCN reaction that required hospitalization: No Has patient had a PCN reaction occurring within the last 10 years: No If all of the above answers are "NO", then may proceed with Cephalosporin use.     FAMILY HISTORY: Family History  Problem Relation Age of Onset   Congestive Heart Failure Father    Breast cancer Niece     SOCIAL HISTORY: Social History   Socioeconomic History   Marital status: Married    Spouse name: Not on file   Number of children: Not on file   Years of education: Not on file   Highest education level: 12th grade  Occupational History   Not on file  Tobacco Use   Smoking status: Never    Passive exposure: Past   Smokeless tobacco: Never  Vaping Use   Vaping status: Never Used  Substance and Sexual Activity   Alcohol use: Never   Drug use: Never   Sexual activity: Not Currently    Birth control/protection: None  Other Topics Concern   Not on file  Social History Narrative   Are you right handed or left handed? Right    Are you currently employed ? Retired    What is your current occupation?   Do you live at home alone?  No    Who lives with you? Family    What type of home do you live in: 1 story or 2 story?  Steps to basement        Social Drivers of Health   Financial Resource Strain: Low Risk  (09/19/2022)   Overall Financial Resource Strain (CARDIA)    Difficulty of Paying Living Expenses: Not hard at all  Food Insecurity: No Food Insecurity (09/19/2022)   Hunger Vital Sign    Worried About Running Out of Food in the Last Year: Never true    Ran Out of Food in the Last Year: Never true  Transportation Needs: Unmet Transportation Needs (09/19/2022)   PRAPARE - Administrator, Civil Service (Medical): Yes    Lack of Transportation (Non-Medical): No  Physical Activity: Insufficiently Active (09/19/2022)   Exercise Vital Sign    Days of Exercise per Week: 1 day    Minutes of Exercise per Session:  20 min  Stress: No Stress Concern Present (09/19/2022)   [employer] of Occupational Health - Occupational Stress Questionnaire    Feeling of Stress : Only a little  Social Connections: Socially Integrated (09/19/2022)   Social Connection and Isolation Panel [NHANES]    Frequency of Communication with Friends and Family: More than three times a week    Frequency of Social Gatherings with Friends and Family: Twice a week    Attends Religious Services: More than 4 times per year    Active Member of [employer] or Organizations: Yes    Attends Engineer, structural: More than 4 times per year    Marital Status: Married  Catering manager Violence: Not on file     PHYSICAL EXAM: Vitals:   08/11/23 0815  BP: (!) 154/81  Pulse: 77  SpO2: 97%   General: No acute distress Head:  Normocephalic/atraumatic Skin/Extremities: No rash, no edema Neurological Exam: alert and awake. No aphasia or dysarthria. Fund of knowledge is appropriate.  Attention and concentration are normal.   Cranial nerves: Pupils equal, round. Extraocular movements intact with no nystagmus. Visual fields full.  No facial asymmetry.  Motor: Bulk and tone normal, muscle strength 5/5 throughout with no pronator drift. Reflexes +1 throughout R>L. Finger to nose testing intact.  Gait narrow-based and steady, able to tandem walk adequately.  Romberg negative.   IMPRESSION: This is a very pleasant 73 yo RH woman with a history of diverticulitis who had a transient 15-minute episode on of confusion where she read a different name on her phone and could not recall her husband's phone code on 09/18/22. Brain MRI on 09/23/22 showed 9mm focus of restricted diffusion and T2 FLAIR hyperintense signal abnormality within the medial left temporal (with involvement of the hippocampus). Repeat brain MRI with and without contrast done 12/2022 no acute changes, resolution of previously seen in signal in the left hippocampus. Etiology unclear,  infarct (although no residual FLAIR changes on repeat imaging) versus seizure. EEG normal. No further similar symptoms since 09/2022. Continue daily aspirin and control of vascular risk factors. She reports daily headaches at the base of the skull and neck pain, suggestive of cervicogenic headaches. Cervical spine xray will be ordered. She will also be referred to Physical therapy for neck pain. Follow-up in 4-5 months, call for any changes.    Thank you for allowing me to participate in her care.  Please do not hesitate to call for any questions or concerns.    Patrcia Dolly, M.D.   CC:  Dr. Adriana Simas

## 2023-08-22 ENCOUNTER — Ambulatory Visit: Admitting: Family Medicine

## 2023-08-22 VITALS — BP 124/72 | HR 108 | Temp 98.8°F | Ht 62.0 in | Wt 166.2 lb

## 2023-08-22 DIAGNOSIS — J988 Other specified respiratory disorders: Secondary | ICD-10-CM

## 2023-08-22 MED ORDER — PROMETHAZINE-DM 6.25-15 MG/5ML PO SYRP: 5.0000 mL | ORAL_SOLUTION | Freq: Four times a day (QID) | ORAL | 0 refills | Status: DC | PRN

## 2023-08-22 MED ORDER — CEFDINIR 300 MG PO CAPS: 300.0000 mg | ORAL_CAPSULE | Freq: Two times a day (BID) | ORAL | 0 refills | Status: DC

## 2023-08-22 NOTE — Patient Instructions (Signed)
 Zyrtec.  Cough medication as directed.  Antibiotic if symptoms persist.

## 2023-08-24 DIAGNOSIS — J988 Other specified respiratory disorders: Secondary | ICD-10-CM | POA: Insufficient documentation

## 2023-08-24 NOTE — Assessment & Plan Note (Addendum)
 Advised supportive treatment and over-the-counter Zyrtec.  Promethazine DM for cough.  If patient fails to improve or worsens, she can start antibiotic therapy that was provided.

## 2023-08-24 NOTE — Progress Notes (Signed)
 Subjective:  Patient ID: Paige Cole, female    DOB: 1951/03/13  Age: 73 y.o. MRN: [id number]  CC: Respiratory symptoms   HPI:  73 year old female presents for evaluation of the above.  Patient reports that her symptoms started on Tuesday.  She reports sore throat, cough, sinus pain and pressure.  She has been using Advil Cold and Sinus without resolution.  No fever.  She also reports associated headache and ear discomfort.  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   Obesity (BMI 30-39.9) 11/29/2021   History of diverticulitis 05/24/2019    Social Hx   Social History   Socioeconomic History   Marital status: Married    Spouse name: Not on file   Number of children: Not on file   Years of education: Not on file   Highest education level: 12th grade  Occupational History   Not on file  Tobacco Use   Smoking status: Never    Passive exposure: Past   Smokeless tobacco: Never  Vaping Use   Vaping status: Never Used  Substance and Sexual Activity   Alcohol use: Never   Drug use: Never   Sexual activity: Not Currently    Birth control/protection: None  Other Topics Concern   Not on file  Social History Narrative   Are you right handed or left handed? Right    Are you currently employed ? Retired    What is your current occupation?   Do you live at home alone? No    Who lives with you? Family    What type of home do you live in: 1 story or 2 story?  Steps to basement        Social Drivers of Health   Financial Resource Strain: Low Risk  (08/22/2023)   Overall Financial Resource  Strain (CARDIA)    Difficulty of Paying Living Expenses: Not hard at all  Food Insecurity: No Food Insecurity (08/22/2023)   Hunger Vital Sign    Worried About Running Out of Food in the Last Year: Never true    Ran Out of Food in the Last Year: Never true  Transportation Needs: No Transportation Needs (08/22/2023)   PRAPARE - Administrator, Civil Service (Medical): No    Lack of Transportation (Non-Medical): No  Physical Activity: Insufficiently Active (08/22/2023)   Exercise Vital Sign    Days of Exercise per Week: 2 days    Minutes of Exercise per Session: 20 min  Stress: No Stress Concern Present (08/22/2023)   [employer] of Occupational Health - Occupational Stress Questionnaire    Feeling of Stress : Not at all  Social Connections: Socially Integrated (08/22/2023)   Social Connection and Isolation Panel [NHANES]    Frequency of Communication with Friends and Family: More than three times a week    Frequency of Social Gatherings with Friends and Family: Once a week    Attends Religious Services: More than 4 times per year    Active Member of [employer] or Organizations: Yes    Attends Engineer, structural: More than 4 times per year    Marital Status: Married    Review  of Systems Per HPI  Objective:  BP 124/72   Pulse (!) 108   Temp 98.8 F (37.1 C)   Ht 5\' 2"  (1.575 m)   Wt 166 lb 3.2 oz (75.4 kg)   SpO2 98%   BMI 30.40 kg/m      08/22/2023   10:05 AM 08/11/2023    8:15 AM 03/26/2023    8:20 AM  BP/Weight  Systolic BP 124 154 140  Diastolic BP 72 81 75  Wt. (Lbs) 166.2 168.2   BMI 30.4 kg/m2 30.76 kg/m2     Physical Exam Vitals and nursing note reviewed.  Constitutional:      General: She is not in acute distress.    Appearance: Normal appearance.  HENT:     Head: Normocephalic and atraumatic.     Right Ear: Tympanic membrane normal.     Left Ear: Tympanic membrane normal.     Mouth/Throat:     Pharynx: Oropharynx is clear.   Cardiovascular:     Rate and Rhythm: Normal rate and regular rhythm.  Pulmonary:     Effort: Pulmonary effort is normal.     Breath sounds: Normal breath sounds. No wheezing or rales.  Neurological:     Mental Status: She is alert.     Lab Results  Component Value Date   WBC 4.3 09/23/2022   HGB 13.8 09/23/2022   HCT 43.2 09/23/2022   PLT 243 09/23/2022   GLUCOSE 88 09/23/2022   CHOL 230 (H) 09/23/2022   TRIG 124 09/23/2022   HDL 50 09/23/2022   LDLCALC 158 (H) 09/23/2022   ALT 14 09/23/2022   AST 14 09/23/2022   NA 141 09/23/2022   K 4.4 09/23/2022   CL 102 09/23/2022   CREATININE 0.75 09/23/2022   BUN 17 09/23/2022   CO2 24 09/23/2022   TSH 2.920 09/23/2022   HGBA1C 5.8 (H) 09/23/2022     Assessment & Plan:  Respiratory infection Assessment & Plan: Advised supportive treatment and over-the-counter Zyrtec.  Promethazine DM for cough.  If patient fails to improve or worsens, she can start antibiotic therapy that was provided.   Other orders -     Cefdinir; Take 1 capsule (300 mg total) by mouth 2 (two) times daily.  Dispense: 20 capsule; Refill: 0 -     Promethazine-DM; Take 5 mLs by mouth 4 (four) times daily as needed for cough.  Dispense: 118 mL; Refill: 0    Follow-up:  Return if symptoms worsen or fail to improve.  Everlene Other DO [facility]

## 2023-08-29 ENCOUNTER — Ambulatory Visit (HOSPITAL_COMMUNITY)

## 2023-09-08 DIAGNOSIS — H35372 Puckering of macula, left eye: Secondary | ICD-10-CM | POA: Diagnosis not present

## 2023-12-11 ENCOUNTER — Encounter: Payer: Self-pay | Admitting: Neurology

## 2023-12-11 ENCOUNTER — Ambulatory Visit: Admitting: Neurology

## 2023-12-11 VITALS — BP 157/85 | HR 79 | Ht 62.0 in | Wt 168.0 lb

## 2023-12-11 DIAGNOSIS — G4486 Cervicogenic headache: Secondary | ICD-10-CM | POA: Diagnosis not present

## 2023-12-11 DIAGNOSIS — M542 Cervicalgia: Secondary | ICD-10-CM

## 2023-12-11 NOTE — Progress Notes (Signed)
 NEUROLOGY FOLLOW UP OFFICE NOTE  Paige Cole [id number] 05/30/1950  HISTORY OF PRESENT ILLNESS: I had the pleasure of seeing Paige Cole in follow-up in the neurology clinic on 12/11/2023.  The patient was last seen 4 months ago. 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 There was also a 6mm T2 hyperintense parenchymal focus along the anteroinferior aspect of the third ventricle on the left, with minimal surrounding FLAIR hyperintensity, possibly a prominent perivascular space. Repeat brain MRI with and without contrast 12/2022 with no acute changes, resolution of previously seen in signal in the left hippocampus. The hypothalamic finding on the left showed CSF intensity region without enhancement or enlargement, etiology indeterminate but likely benign. She had a normal 1-hour EEG in 09/2022.   On her last visit, she reported daily headaches in the back of her head, neck pain, suggestive of cervicogenic headaches. Cervical spine xray ordered however she reports she did not hear back for scheduling. She was referred to Physical therapy but wanted to do xray first. Since then, she reports headaches are not daily anymore, they occur once a week, still in same location with a lot of neck pain (neck crunches with head turning). She takes Tylenol  which helps dull the pain. Pain usually occurs around noon, she lays down. Sometimes she is laying down and feels the pain. She has noticed buzzing in both ears L>R, no hearing loss. Every now and then she has the zap when she lays in bed at night, it is tiny and not very often. She denies any focal numbness/tingling/weakness, no bowel/bladder dysfunction. No falls. No further episodes of alexia, no staring/unresponsive episodes.    History on  Initial Assessment 09/30/2022: This is a very pleasant 73 year old right-handed woman with a history of diverticulitis, presenting for evaluation of stroke. She was in her usual state of health until 09/18/22, she was feeling fine and just finished reading her bible when she picked up her phone to look at the calendar for a birthday. She states she could not make heads or tails of it, she was looking at a name/letters, but read some other name. It did not look real, feeling dreamlike when she was looking at the calendar. She could not think of names of her extended family. Her daughter called and talked to her, with no speech changes noted. Her husband's phone went off with a text message, she could not think of the password but knew how to look it up. It lasted 15 minutes. She felt normal mentally after but did feel tired and took 2 naps. She felt completely fine after the second nap. There was no associated headache, dizziness, focal numbness/tingling/weakness. No further similar episodes, but since then she has had episodes of feeling lightheaded while sitting. She would feel it for 1-2 seconds after looking up. She feels better when she is up and moving around. She has had pings/brief sharp pains on either side of her head.She has been having palpitations off and on, and was having them a lot after the incident, with heavy pain in the mid-chest. She has not had them in the past 2 days.  She denies any staring/unresponsive episodes, gaps in time, olfactory/gustatory hallucinations, deja vu, rising epigastric sensation, focal numbness/tingling/weakness, myoclonic jerks. She has a history of headaches for several years that  got better after she retired 5 years ago, none recently. They mostly occur when she needs caffeine , there is some nausea, no photo/phonophobia. She denies any diplopia, dysarthria/dysphagia, neck/back pain, bowel/bladder dysfunction. She gets 8 hours of sleep. No alcohol or tobacco use. She had  a normal birth and early development.  There is no history of febrile convulsions, CNS infections such as meningitis/encephalitis, significant traumatic brain injury, neurosurgical procedures, or family history of seizures.  I personally reviewed brain MRI without contrast done 09/23/22 which showed a 9mm focus of restricted diffusion and T2 FLAIR hyperintense signal abnormality within the medial left temporal (with involvement of the hippocampus). There was also a 6mm T2 hyperintense parenchymal focus along the anteroinferior aspect of the third ventricle on the left, with minimal surrounding FLAIR hyperintensity, possibly a prominent perivascular space but interval MRI was recommended. Echocardiogram showed EF 55-60%, normal left atrium. CTA head and neck no significant stenosis. She was started on aspirin  81mg  daily. She had a lipid panel with LDL of 158. HbA1c 5.8. BP today 155/72, she notes at home it runs in 133/70-80s range.  PAST MEDICAL HISTORY: History reviewed. No pertinent past medical history.  MEDICATIONS: Current Outpatient Medications on File Prior to Visit  Medication Sig Dispense Refill   aspirin  EC (ADULT ASPIRIN  REGIMEN) 81 MG tablet Take 81 mg by mouth once.     promethazine -dextromethorphan (PROMETHAZINE -DM) 6.25-15 MG/5ML syrup Take 5 mLs by mouth 4 (four) times daily as needed for cough. 118 mL 0   cefdinir  (OMNICEF ) 300 MG capsule Take 1 capsule (300 mg total) by mouth 2 (two) times daily. 20 capsule 0   pantoprazole  (PROTONIX ) 40 MG tablet TAKE 1 TABLET BY MOUTH EVERY DAY (Patient not taking: Reported on 12/11/2023) 60 tablet 2   No current facility-administered medications on file prior to visit.    ALLERGIES: Allergies  Allergen Reactions   Penicillins Rash    Has patient had a PCN reaction causing immediate rash, facial/tongue/throat swelling, SOB or lightheadedness with hypotension: No Has patient had a PCN reaction causing severe rash involving mucus membranes or  skin necrosis: No Has patient had a PCN reaction that required hospitalization: No Has patient had a PCN reaction occurring within the last 10 years: No If all of the above answers are NO, then may proceed with Cephalosporin use.     FAMILY HISTORY: Family History  Problem Relation Age of Onset   Congestive Heart Failure Father    Breast cancer Niece     SOCIAL HISTORY: Social History   Socioeconomic History   Marital status: Married    Spouse name: Not on file   Number of children: Not on file   Years of education: Not on file   Highest education level: 12th grade  Occupational History   Not on file  Tobacco Use   Smoking status: Never    Passive exposure: Past   Smokeless tobacco: Never  Vaping Use   Vaping status: Never Used  Substance and Sexual Activity   Alcohol use: Never   Drug use: Never   Sexual activity: Not Currently    Birth control/protection: None  Other Topics Concern   Not on file  Social History Narrative   Are you right handed or left handed? Right    Are you currently employed ? Retired    What is your current occupation?   Do you live at home alone? No    Who lives with you? Family    What type of home do  you live in: 1 story or 2 story?  Steps to basement        Social Drivers of Health   Financial Resource Strain: Low Risk  (08/22/2023)   Overall Financial Resource Strain (CARDIA)    Difficulty of Paying Living Expenses: Not hard at all  Food Insecurity: No Food Insecurity (08/22/2023)   Hunger Vital Sign    Worried About Running Out of Food in the Last Year: Never true    Ran Out of Food in the Last Year: Never true  Transportation Needs: No Transportation Needs (08/22/2023)   PRAPARE - Administrator, Civil Service (Medical): No    Lack of Transportation (Non-Medical): No  Physical Activity: Insufficiently Active (08/22/2023)   Exercise Vital Sign    Days of Exercise per Week: 2 days    Minutes of Exercise per Session: 20  min  Stress: No Stress Concern Present (08/22/2023)   [employer] of Occupational Health - Occupational Stress Questionnaire    Feeling of Stress : Not at all  Social Connections: Socially Integrated (08/22/2023)   Social Connection and Isolation Panel    Frequency of Communication with Friends and Family: More than three times a week    Frequency of Social Gatherings with Friends and Family: Once a week    Attends Religious Services: More than 4 times per year    Active Member of [employer] or Organizations: Yes    Attends Engineer, structural: More than 4 times per year    Marital Status: Married  Catering manager Violence: Not on file     PHYSICAL EXAM: Vitals:   12/11/23 1121  BP: (!) 157/85  Pulse: 79  SpO2: 96%   General: No acute distress Head:  Normocephalic/atraumatic Skin/Extremities: No rash, no edema Neurological Exam: alert and awake. No aphasia or dysarthria. Fund of knowledge is appropriate.    Attention and concentration are normal.   Cranial nerves: Pupils equal, round. Extraocular movements intact with no nystagmus. Visual fields full.  No facial asymmetry.  Motor: Bulk and tone normal, muscle strength 5/5 throughout with no pronator drift.  Reflexes +1 throughout. Finger to nose testing intact.  Gait narrow-based and steady, no ataxia.    IMPRESSION: This is a very pleasant 73 yo RH woman with a history of diverticulitis who had a transient 15-minute episode on of confusion where she read a different name on her phone and could not recall her husband's phone code on 09/18/22. Brain MRI on 09/23/22 showed 9mm focus of restricted diffusion and T2 FLAIR hyperintense signal abnormality within the medial left temporal (with involvement of the hippocampus). Repeat brain MRI with and without contrast done 12/2022 no acute changes, resolution of previously seen in signal in the left hippocampus. Etiology unclear, infarct (although no residual FLAIR changes on repeat imaging)  versus seizure. EEG normal. No further similar symptoms since 09/2022. She continues to report headaches at the base of her skull and neck pain, suggestive of cervicogenic headaches. Proceed with cervical spine xray and physical therapy as previously planned. Follow-up in 6 months, call for any changes.   Thank you for allowing me to participate in her care.  Please do not hesitate to call for any questions or concerns.    Darice Shivers, M.D.   CC: Dr. Bluford

## 2023-12-11 NOTE — Patient Instructions (Addendum)
 It's always a pleasure to see you.  Schedule cervical spine xray  2. Proceed with physical therapy for neck pain  3. Follow-up in 6 months, call for any changes  [facility] [phone]

## 2023-12-12 ENCOUNTER — Ambulatory Visit (HOSPITAL_COMMUNITY)
Admission: RE | Admit: 2023-12-12 | Discharge: 2023-12-12 | Disposition: A | Discharge status: Home / Self Care | Source: Ambulatory Visit | Attending: Neurology | Admitting: Neurology

## 2023-12-12 DIAGNOSIS — M542 Cervicalgia: Secondary | ICD-10-CM | POA: Insufficient documentation

## 2023-12-12 DIAGNOSIS — G4486 Cervicogenic headache: Secondary | ICD-10-CM | POA: Insufficient documentation

## 2023-12-12 DIAGNOSIS — M4802 Spinal stenosis, cervical region: Secondary | ICD-10-CM | POA: Diagnosis not present

## 2023-12-12 DIAGNOSIS — M47812 Spondylosis without myelopathy or radiculopathy, cervical region: Secondary | ICD-10-CM | POA: Diagnosis not present

## 2023-12-19 ENCOUNTER — Encounter: Payer: Self-pay | Admitting: Neurology

## 2023-12-19 ENCOUNTER — Ambulatory Visit: Payer: Self-pay | Admitting: Neurology

## 2023-12-19 ENCOUNTER — Other Ambulatory Visit: Payer: Self-pay | Admitting: *Deleted

## 2023-12-19 DIAGNOSIS — G4486 Cervicogenic headache: Secondary | ICD-10-CM

## 2023-12-19 DIAGNOSIS — M542 Cervicalgia: Secondary | ICD-10-CM

## 2024-01-27 ENCOUNTER — Ambulatory Visit (HOSPITAL_COMMUNITY): Attending: Neurology

## 2024-01-27 ENCOUNTER — Other Ambulatory Visit: Payer: Self-pay

## 2024-01-27 ENCOUNTER — Encounter (HOSPITAL_COMMUNITY): Payer: Self-pay

## 2024-01-27 DIAGNOSIS — M542 Cervicalgia: Secondary | ICD-10-CM | POA: Insufficient documentation

## 2024-01-27 DIAGNOSIS — G4486 Cervicogenic headache: Secondary | ICD-10-CM | POA: Diagnosis not present

## 2024-01-27 NOTE — Therapy (Signed)
 OUTPATIENT PHYSICAL THERAPY CERVICAL EVALUATION   Patient Name: Paige Cole MRN: [id number] DOB:[birth year], 73 y.o., female Today's Date: 01/27/2024  END OF SESSION:  PT End of Session - 01/27/24 0946     Visit Number 1    Date for PT Re-Evaluation 03/09/24    Authorization Type [employer]    Authorization Time Period no auth required    Authorization - Visit Number 0    Progress Note Due on Visit 10    PT Start Time [phone]    PT Stop Time 0930    PT Time Calculation (min) 43 min    Activity Tolerance Patient tolerated treatment well    Behavior During Therapy [facility] for tasks assessed/performed          History reviewed. No pertinent past medical history. Past Surgical History:  Procedure Laterality Date   ABDOMINAL HYSTERECTOMY     COLONOSCOPY N/A 06/08/2018   Procedure: COLONOSCOPY;  Surgeon: Golda Claudis PENNER, MD;  [facility];  Service: Endoscopy;  Laterality: N/A;  7:30   COLONOSCOPY WITH PROPOFOL  N/A 01/16/2022   Procedure: COLONOSCOPY WITH PROPOFOL ;  Surgeon: Eartha Angelia Sieving, MD;  Location: [facility];  Service: Gastroenterology;  Laterality: N/A;  730 ASA 2   ESOPHAGOGASTRODUODENOSCOPY (EGD) WITH PROPOFOL  N/A 03/26/2023   Procedure: ESOPHAGOGASTRODUODENOSCOPY (EGD) WITH PROPOFOL ;  Surgeon: Eartha Angelia Sieving, MD;  Location: [facility];  Service: Gastroenterology;  Laterality: N/A;  8:45AM;ASA 1   POLYPECTOMY  06/08/2018   Procedure: POLYPECTOMY;  Surgeon: Golda Claudis PENNER, MD;  Location: [facility];  Service: Endoscopy;;  cold snare [place] x 1, DC x1   POLYPECTOMY  01/16/2022   Procedure: POLYPECTOMY;  Surgeon: Eartha Angelia Sieving, MD;  [facility]: [facility];  Service: Gastroenterology;;   Patient Active Problem List   Diagnosis Date Noted   Respiratory infection 08/24/2023   GERD (gastroesophageal reflux disease) 03/06/2023   NSVT (nonsustained ventricular tachycardia) (HCC) 11/27/2022   Non-cardiac chest pain  11/25/2022   Palpitations 11/25/2022   Prediabetes 11/18/2022   Hyperlipidemia 11/18/2022   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   Obesity (BMI 30-39.9) 11/29/2021   History of diverticulitis 05/24/2019    PCP: Bluford Jacqulyn MATSU, DO   REFERRING PROVIDER: Georjean Darice HERO, MD  REFERRING DIAG: M54.2 (ICD-10-CM) - Neck pain 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 Pt has kept a written record of the bad headaches, July 23, 25, 29, 31; Aug 5, 6, 7, 8, 15; Sept 6, 8. Pt has found that walking helps as well as just getting up and moving around.  Pt states that self mobilizations and STM of paraspinals has helped some. Pt states no pain in the arms, denies any numbness or tingling in arms or hands. Pt states the headaches tend to start in the afternoon. Pt states last one was a small one yesterday afternoon.   Hand dominance: Right  PERTINENT HISTORY:  None reported  PAIN:  Are you having pain? Yes: NPRS scale: 7-8/10 at worst, last one was the 6th of September Pain location: base of skull scalp Pain description: headache Aggravating factors: afternoon, laying with pillows pushing head up Relieving factors: getting up and walking around  PRECAUTIONS: None  RED FLAGS: None     WEIGHT BEARING RESTRICTIONS:  No  FALLS:  Has patient fallen in last 6 months? No  OCCUPATION: Psychologist, forensic retired  PLOF: Independent and Independent with basic ADLs  PATIENT GOALS: To decreased intensity and frequency of headaches, to get better posture  NEXT MD VISIT: March   OBJECTIVE:  Note: Objective measures were completed at Evaluation unless otherwise noted.  DIAGNOSTIC FINDINGS:  CLINICAL DATA:  Neck pain for 1 year, no known injury, initial encounter   EXAM: CERVICAL SPINE - COMPLETE 4+ VIEW   COMPARISON:  None Available.   FINDINGS: Seven cervical segments are well visualized. Vertebral body height is well maintained. Multilevel osteophytic changes are seen. Neural foramina show mild narrowing bilaterally. The odontoid is within normal limits. No prevertebral soft tissue abnormality is seen.   IMPRESSION: Degenerative changes with mild bilateral neural foraminal narrowing.  PATIENT SURVEYS:  NDI: TBA  COGNITION: Overall cognitive status: Within functional limits for tasks assessed  SENSATION: WFL  POSTURE: rounded shoulders and forward head  PALPATION: Tenderness to bilateral UT musculature   CERVICAL ROM:   Active ROM A/PROM (deg) eval  Flexion 35, crepitus reported  Extension 45, pull in left upper trap  Right lateral flexion 30  Left lateral flexion 30  Right rotation 70  Left rotation 64   (Blank rows = not tested)  UPPER EXTREMITY ROM:  Active ROM Right eval Left eval  Shoulder flexion    Shoulder extension    Shoulder abduction    Shoulder adduction    Shoulder extension    Shoulder internal rotation    Shoulder external rotation    Elbow flexion    Elbow extension    Wrist flexion    Wrist extension    Wrist ulnar deviation    Wrist radial deviation    Wrist pronation    Wrist supination     (Blank rows = not tested)  UPPER EXTREMITY MMT:  MMT Right eval Left eval  Shoulder flexion    Shoulder extension    Shoulder abduction     Shoulder adduction    Shoulder extension    Shoulder internal rotation    Shoulder external rotation    Middle trapezius 3+ 3+  Lower trapezius 3- 3-  Elbow flexion    Elbow extension    Wrist flexion    Wrist extension    Wrist ulnar deviation    Wrist radial deviation    Wrist  pronation    Wrist supination    Grip strength     (Blank rows = not tested)  CERVICAL SPECIAL TESTS:  None performed this date    TREATMENT DATE:  01/27/2024  Evaluation: -ROM measured, Strength assessed, HEP prescribed, pt educated on prognosis, findings, and importance of HEP compliance if given.                                                                                                                                   PATIENT EDUCATION:  Education details: Pt was educated on findings of PT evaluation, prognosis, frequency of therapy visits and rationale, attendance policy, and HEP if given.   Person educated: Patient Education method: Explanation, Verbal cues, and Handouts Education comprehension: verbalized understanding, verbal cues required, and needs further education  HOME EXERCISE PROGRAM: Access Code: [facility] URL: [email or web address] Date: 01/27/2024 Prepared by: Lang Ada  Exercises - Seated Scapular Retraction  - 1 x daily - 7 x weekly - 3 sets - 10 reps - 3 hold - Seated Upper Trapezius Stretch  - 1 x daily - 7 x weekly - 1 sets - 3 reps - 20 hold - Seated Cervical Retraction  - 1 x daily - 7 x weekly - 3 sets - 10 reps - 3 hold  ASSESSMENT:  CLINICAL IMPRESSION: Patient is a 73 y.o. 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   Patient demonstrates decreased cervical spine ROM, increased pain in neck, and increased tension in bilateral upper trapezius and paraspinals of cervical spine. Patient also suffering from increased frequency and intensity  of headaches for the past 2 months. Patient also demonstrates tenderness to upper thoracic spinal segments to T3 vertebrae. Patient requires education on origin of cervicogenic headaches and why headaches seem to come on later in the day. Pt also educated on importance of corrective posturing as this is thought to be the main cause of pts symptoms in light of the degeneration that is present in pts cervical spine. Patient would benefit from skilled physical therapy for decreased headache frequency and intensity, increased strength in paraspinal and other postural musculature, and improved posture for improved ability to work without symptom reproduction, return to higher level of function with ADLs, and progress towards therapy goals.    OBJECTIVE IMPAIRMENTS: decreased activity tolerance, decreased ROM, decreased strength, hypomobility, and pain.   ACTIVITY LIMITATIONS: lifting, dressing, and reach over head  PARTICIPATION LIMITATIONS: meal prep, cleaning, laundry, shopping, community activity, and yard work  PERSONAL FACTORS: Age, Past/current experiences, and Time since onset of injury/illness/exacerbation are also affecting patient's functional outcome.   REHAB POTENTIAL: Good  CLINICAL DECISION MAKING: Stable/uncomplicated  EVALUATION COMPLEXITY: Low   GOALS: Goals reviewed with patient? No  SHORT TERM GOALS: Target date: 02/17/24  Pt will be independent with HEP in order to demonstrate participation in Physical Therapy POC.  Baseline: Goal status: INITIAL  2.  Pt will report 5/10 headache pain at worst with cervical mobility in order to demonstrate improved pain with ADLs and decreased intensity of headaches.  Baseline:  Goal status: INITIAL  LONG TERM GOALS: Target date: 03/09/24  Pt will report decreased cervicogenic caused headaches to less than 2 per week in order to demonstrate improved quality of life.  Baseline: see eval subjective for pt recordings.  Goal status:  INITIAL  2.  Pt will improve cervical ROM (flex/ext/lateral flexion/rotation) by 15 degrees combined in order to demonstrate improved functional ambulatory capacity in community setting.  Baseline: see objective.  Goal status: INITIAL  3.  Pt will improve NDI score by at least 11.75 points in order to demonstrate decreased pain with functional goals and outcomes. Baseline: see objective.  Goal status: INITIAL  4.  Pt will report 3/10 pain with cervical mobility in order to demonstrate reduced pain with ADLs that require use of cervical spine musculature (driving, washing hair, reaching to elevated cabinet).  Baseline: see objective.  Goal status: INITIAL     PLAN:  PT FREQUENCY: 1-2x/week  PT DURATION: 6 weeks  PLANNED INTERVENTIONS: 97110-Therapeutic exercises, 97530- Therapeutic activity, [id number]- Neuromuscular re-education, 97535- Self Care, [id number]- Manual therapy, Spinal mobilization, Cryotherapy, and Moist heat  PLAN FOR NEXT SESSION: NDI, progress postural musculature endurance and strength of cervical paraspinals and scapular musculature.   Lang Ada, PT, DPT [facility] Office: [phone] 9:51 AM, 01/27/24

## 2024-01-29 ENCOUNTER — Ambulatory Visit (HOSPITAL_COMMUNITY)

## 2024-01-30 ENCOUNTER — Ambulatory Visit (HOSPITAL_COMMUNITY)

## 2024-01-30 DIAGNOSIS — G4486 Cervicogenic headache: Secondary | ICD-10-CM

## 2024-01-30 DIAGNOSIS — M542 Cervicalgia: Secondary | ICD-10-CM

## 2024-01-30 NOTE — Therapy (Signed)
 OUTPATIENT PHYSICAL THERAPY CERVICAL EVALUATION   Patient Name: Paige Cole MRN: [id number] DOB:1950/11/29, 73 y.o., female Today's Date: 01/30/2024  END OF SESSION:  PT End of Session - 01/30/24 0800     Visit Number 2    Number of Visits 12    Date for PT Re-Evaluation 03/09/24    Authorization Type [employer]    Authorization Time Period no auth required    Progress Note Due on Visit 10    PT Start Time 0800    PT Stop Time 0840    PT Time Calculation (min) 40 min    Activity Tolerance Patient tolerated treatment well    Behavior During Therapy [facility] for tasks assessed/performed           No past medical history on file. Past Surgical History:  Procedure Laterality Date   ABDOMINAL HYSTERECTOMY     COLONOSCOPY N/A 06/08/2018   Procedure: COLONOSCOPY;  Surgeon: Golda Claudis PENNER, MD;  [facility];  Service: Endoscopy;  Laterality: N/A;  7:30   COLONOSCOPY WITH PROPOFOL  N/A 01/16/2022   Procedure: COLONOSCOPY WITH PROPOFOL ;  Surgeon: Eartha Angelia Sieving, MD;  [facility];  Service: Gastroenterology;  Laterality: N/A;  730 ASA 2   ESOPHAGOGASTRODUODENOSCOPY (EGD) WITH PROPOFOL  N/A 03/26/2023   Procedure: ESOPHAGOGASTRODUODENOSCOPY (EGD) WITH PROPOFOL ;  Surgeon: Eartha Angelia Sieving, MD;  [facility];  Service: Gastroenterology;  Laterality: N/A;  8:45AM;ASA 1   POLYPECTOMY  06/08/2018   Procedure: POLYPECTOMY;  Surgeon: Golda Claudis PENNER, MD;  [facility];  Service: Endoscopy;;  cold snare [place] x 1, DC x1   POLYPECTOMY  01/16/2022   Procedure: POLYPECTOMY;  Surgeon: Eartha Angelia Sieving, MD;  [facility];  Service: Gastroenterology;;   Patient Active Problem List   Diagnosis Date Noted   Respiratory infection 08/24/2023   GERD (gastroesophageal reflux disease) 03/06/2023   NSVT (nonsustained ventricular tachycardia) (HCC) 11/27/2022   Non-cardiac chest pain 11/25/2022   Palpitations  11/25/2022   Prediabetes 11/18/2022   Hyperlipidemia 11/18/2022   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   Obesity (BMI 30-39.9) 11/29/2021   History of diverticulitis 05/24/2019    PCP: Bluford Jacqulyn MATSU, DO   REFERRING PROVIDER: Georjean Darice HERO, MD  REFERRING DIAG: M54.2 (ICD-10-CM) - Neck pain G44.86 (ICD-10-CM) - Cervicogenic headache  THERAPY DIAG:  Cervicalgia  Cervicogenic headache  Rationale for Evaluation and Treatment: Rehabilitation  ONSET DATE: May of 24, told she had a stroke and shortly after that the headaches started, lesion in brain dissipated  SUBJECTIVE:  SUBJECTIVE STATEMENT: Feeling good today; no headaches since visit.  Has been doing her exercises.    Eval: Pt states she was told she had a stroke in 2024 and started having neck pain and headaches after that, states the stroke lesion has since dissipated. Pt has kept a written record of the bad headaches, July 23, 25, 29, 31; Aug 5, 6, 7, 8, 15; Sept 6, 8. Pt has found that walking helps as well as just getting up and moving around.  Pt states that self mobilizations and STM of paraspinals has helped some. Pt states no pain in the arms, denies any numbness or tingling in arms or hands. Pt states the headaches tend to start in the afternoon. Pt states last one was a small one yesterday afternoon.   Hand dominance: Right  PERTINENT HISTORY:  None reported  PAIN:  Are you having pain? Yes: NPRS scale: 7-8/10 at worst, last one was the 6th of September Pain location: base of skull scalp Pain description: headache Aggravating factors: afternoon, laying with pillows pushing head up Relieving factors: getting up and walking around  PRECAUTIONS:  None  RED FLAGS: None     WEIGHT BEARING RESTRICTIONS: No  FALLS:  Has patient fallen in last 6 months? No  OCCUPATION: Psychologist, forensic retired  PLOF: Independent and Independent with basic ADLs  PATIENT GOALS: To decreased intensity and frequency of headaches, to get better posture  NEXT MD VISIT: March   OBJECTIVE:  Note: Objective measures were completed at Evaluation unless otherwise noted.  DIAGNOSTIC FINDINGS:  CLINICAL DATA:  Neck pain for 1 year, no known injury, initial encounter   EXAM: CERVICAL SPINE - COMPLETE 4+ VIEW   COMPARISON:  None Available.   FINDINGS: Seven cervical segments are well visualized. Vertebral body height is well maintained. Multilevel osteophytic changes are seen. Neural foramina show mild narrowing bilaterally. The odontoid is within normal limits. No prevertebral soft tissue abnormality is seen.   IMPRESSION: Degenerative changes with mild bilateral neural foraminal narrowing.  PATIENT SURVEYS:  NDI: TBA  COGNITION: Overall cognitive status: Within functional limits for tasks assessed  SENSATION: WFL  POSTURE: rounded shoulders and forward head  PALPATION: Tenderness to bilateral UT musculature   CERVICAL ROM:   Active ROM A/PROM (deg) eval  Flexion 35, crepitus reported  Extension 45, pull in left upper trap  Right lateral flexion 30  Left lateral flexion 30  Right rotation 70  Left rotation 64   (Blank rows = not tested)  UPPER EXTREMITY ROM:  Active ROM Right eval Left eval  Shoulder flexion    Shoulder extension    Shoulder abduction    Shoulder adduction    Shoulder extension    Shoulder internal rotation    Shoulder external rotation    Elbow flexion    Elbow extension    Wrist flexion    Wrist extension    Wrist ulnar deviation    Wrist radial deviation    Wrist pronation    Wrist supination     (Blank rows = not tested)  UPPER EXTREMITY MMT:  MMT Right eval Left eval  Shoulder  flexion    Shoulder extension    Shoulder abduction    Shoulder adduction    Shoulder extension    Shoulder internal rotation    Shoulder external rotation    Middle trapezius 3+ 3+  Lower trapezius 3- 3-  Elbow flexion    Elbow extension    Wrist flexion    Wrist  extension    Wrist ulnar deviation    Wrist radial deviation    Wrist pronation    Wrist supination    Grip strength     (Blank rows = not tested)  CERVICAL SPECIAL TESTS:  None performed this date    TREATMENT DATE:  01/30/24 Review of HEP and goals NDI 5/50 10% Moist heat x 5' to decrease muscle tightness Cervical retraction x 10 Scapular retraction 5 x 10 Upper trap stretching 20 hold x 3 each Cervical rotation stretch with towel 10 x 3 each STM to bilateral upper traps x 10' to decrease pain and spasm; no other interventions performed with manual     01/27/2024  Evaluation: -ROM measured, Strength assessed, HEP prescribed, pt educated on prognosis, findings, and importance of HEP compliance if given.                                                                                                                                   PATIENT EDUCATION:  Education details: Pt was educated on findings of PT evaluation, prognosis, frequency of therapy visits and rationale, attendance policy, and HEP if given.   Person educated: Patient Education method: Explanation, Verbal cues, and Handouts Education comprehension: verbalized understanding, verbal cues required, and needs further education  HOME EXERCISE PROGRAM: Access Code: [facility] URL: [email or web address] Date: 01/30/2024 - Seated Assisted Cervical Rotation with Towel  - 1 x daily - 7 x weekly - 1 sets - 3 reps - 10 sec hold Access Code: [facility] URL: [email or web address] Date: 01/27/2024 Prepared by: Lang Ada  Exercises - Seated Scapular Retraction  - 1 x daily - 7 x weekly - 3 sets - 10 reps - 3 hold -  Seated Upper Trapezius Stretch  - 1 x daily - 7 x weekly - 1 sets - 3 reps - 20 hold - Seated Cervical Retraction  - 1 x daily - 7 x weekly - 3 sets - 10 reps - 3 hold  ASSESSMENT:  CLINICAL IMPRESSION: Today's session started with a review of HEP and goals; patient verbalizes agreement with set rehab goals. Patient continues with some upper trap tightness so put moist heat on upper traps x 5' to decrease muscle tightness followed by STM to the same with noted more spasm left upper trap versus right; overall decreased after treatment.  NDI with 5/50 score.  Added cervical rotation stretch with towel to treatment and updated to HEP.  Patient will benefit from continued skilled therapy services to address deficits and promote return to optimal function.      Eval: Patient is a 73 y.o. 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   Patient demonstrates decreased cervical spine ROM, increased pain in neck, and increased tension in bilateral upper trapezius and paraspinals of cervical spine. Patient also suffering from increased frequency and intensity of headaches for  the past 2 months. Patient also demonstrates tenderness to upper thoracic spinal segments to T3 vertebrae. Patient requires education on origin of cervicogenic headaches and why headaches seem to come on later in the day. Pt also educated on importance of corrective posturing as this is thought to be the main cause of pts symptoms in light of the degeneration that is present in pts cervical spine. Patient would benefit from skilled physical therapy for decreased headache frequency and intensity, increased strength in paraspinal and other postural musculature, and improved posture for improved ability to work without symptom reproduction, return to higher level of function with ADLs, and progress towards therapy goals.    OBJECTIVE  IMPAIRMENTS: decreased activity tolerance, decreased ROM, decreased strength, hypomobility, and pain.   ACTIVITY LIMITATIONS: lifting, dressing, and reach over head  PARTICIPATION LIMITATIONS: meal prep, cleaning, laundry, shopping, community activity, and yard work  PERSONAL FACTORS: Age, Past/current experiences, and Time since onset of injury/illness/exacerbation are also affecting patient's functional outcome.   REHAB POTENTIAL: Good  CLINICAL DECISION MAKING: Stable/uncomplicated  EVALUATION COMPLEXITY: Low   GOALS: Goals reviewed with patient? No  SHORT TERM GOALS: Target date: 02/17/24  Pt will be independent with HEP in order to demonstrate participation in Physical Therapy POC.  Baseline: Goal status: IN PROGRESS  2.  Pt will report 5/10 headache pain at worst with cervical mobility in order to demonstrate improved pain with ADLs and decreased intensity of headaches.  Baseline:  Goal status: IN PROGRESS  LONG TERM GOALS: Target date: 03/09/24  Pt will report decreased cervicogenic caused headaches to less than 2 per week in order to demonstrate improved quality of life.  Baseline: see eval subjective for pt recordings.  Goal status: IN PROGRESS  2.  Pt will improve cervical ROM (flex/ext/lateral flexion/rotation) by 15 degrees combined in order to demonstrate improved functional ambulatory capacity in community setting.  Baseline: see objective.  Goal status: IN PROGRESS  3.  Pt will improve NDI score by at least 11.75 points in order to demonstrate decreased pain with functional goals and outcomes. Baseline: see objective.  Goal status: IN PROGRESS  4.  Pt will report 3/10 pain with cervical mobility in order to demonstrate reduced pain with ADLs that require use of cervical spine musculature (driving, washing hair, reaching to elevated cabinet).  Baseline: see objective.  Goal status: IN PROGRESS     PLAN:  PT FREQUENCY: 1-2x/week  PT DURATION: 6  weeks  PLANNED INTERVENTIONS: 97110-Therapeutic exercises, 97530- Therapeutic activity, [id number]- Neuromuscular re-education, 97535- Self Care, [id number]- Manual therapy, Spinal mobilization, Cryotherapy, and Moist heat  PLAN FOR NEXT SESSION:  progress postural musculature endurance and strength of cervical paraspinals and scapular musculature.   8:00 AM, 01/30/24 Janely Gullickson Small Adelis Docter MPT [place] [facility] [place] [phone]

## 2024-02-02 ENCOUNTER — Ambulatory Visit (HOSPITAL_COMMUNITY)

## 2024-02-02 ENCOUNTER — Encounter (HOSPITAL_COMMUNITY): Payer: Self-pay

## 2024-02-02 DIAGNOSIS — G4486 Cervicogenic headache: Secondary | ICD-10-CM

## 2024-02-02 DIAGNOSIS — M542 Cervicalgia: Secondary | ICD-10-CM

## 2024-02-02 NOTE — Therapy (Signed)
 OUTPATIENT PHYSICAL THERAPY CERVICAL TREATMENT   Patient Name: Paige Cole MRN: [id number] DOB:[birth year], 73 y.o., female Today's Date: 02/02/2024  END OF SESSION:  PT End of Session - 02/02/24 1100     Visit Number 3    Number of Visits 12    Date for PT Re-Evaluation 03/09/24    Authorization Type [employer]    Authorization Time Period no auth required    Progress Note Due on Visit 10    PT Start Time 1104    PT Stop Time 1147    PT Time Calculation (min) 43 min    Activity Tolerance Patient tolerated treatment well    Behavior During Therapy WFL for tasks assessed/performed           History reviewed. No pertinent past medical history. Past Surgical History:  Procedure Laterality Date   ABDOMINAL HYSTERECTOMY     COLONOSCOPY N/A 06/08/2018   Procedure: COLONOSCOPY;  Surgeon: Golda Claudis PENNER, MD;  [facility];  Service: Endoscopy;  Laterality: N/A;  7:30   COLONOSCOPY WITH PROPOFOL  N/A 01/16/2022   Procedure: COLONOSCOPY WITH PROPOFOL ;  Surgeon: Eartha Angelia Sieving, MD;  [facility];  Service: Gastroenterology;  Laterality: N/A;  730 ASA 2   ESOPHAGOGASTRODUODENOSCOPY (EGD) WITH PROPOFOL  N/A 03/26/2023   Procedure: ESOPHAGOGASTRODUODENOSCOPY (EGD) WITH PROPOFOL ;  Surgeon: Eartha Angelia Sieving, MD;  [facility];  Service: Gastroenterology;  Laterality: N/A;  8:45AM;ASA 1   POLYPECTOMY  06/08/2018   Procedure: POLYPECTOMY;  Surgeon: Golda Claudis PENNER, MD;  [facility];  Service: Endoscopy;;  cold snare  x 1, DC x1   POLYPECTOMY  01/16/2022   Procedure: POLYPECTOMY;  Surgeon: Eartha Angelia Sieving, MD;  [facility];  Service: Gastroenterology;;   Patient Active Problem List   Diagnosis Date Noted   Respiratory infection 08/24/2023   GERD (gastroesophageal reflux disease) 03/06/2023   NSVT (nonsustained ventricular tachycardia) (HCC) 11/27/2022   Non-cardiac chest pain 11/25/2022    Palpitations 11/25/2022   Prediabetes 11/18/2022   Hyperlipidemia 11/18/2022   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   Obesity (BMI 30-39.9) 11/29/2021   History of diverticulitis 05/24/2019    PCP: Bluford Jacqulyn MATSU, DO   REFERRING PROVIDER: Georjean Darice HERO, MD  REFERRING DIAG: M54.2 (ICD-10-CM) - Neck pain G44.86 (ICD-10-CM) - Cervicogenic headache  THERAPY DIAG:  Cervicogenic headache  Cervicalgia  Rationale for Evaluation and Treatment: Rehabilitation  ONSET DATE: May of 24, told she had a stroke and shortly after that the headaches started, lesion in brain dissipated  SUBJECTIVE:  SUBJECTIVE STATEMENT: 02/02/24: Reports one headache since last visit, no reports of pain currently.  Exercises have been going well and has been walking around [place].    Eval: Pt states she was told she had a stroke in 2024 and started having neck pain and headaches after that, states the stroke lesion has since dissipated. Pt has kept a written record of the bad headaches, July 23, 25, 29, 31; Aug 5, 6, 7, 8, 15; Sept 6, 8. Pt has found that walking helps as well as just getting up and moving around.  Pt states that self mobilizations and STM of paraspinals has helped some. Pt states no pain in the arms, denies any numbness or tingling in arms or hands. Pt states the headaches tend to start in the afternoon. Pt states last one was a small one yesterday afternoon.   Hand dominance: Right  PERTINENT HISTORY:  None reported  PAIN:  Are you having pain? Yes: NPRS scale: 7-8/10 at worst, last one was the 6th of September Pain location: base of skull scalp Pain description: headache Aggravating factors: afternoon, laying with  pillows pushing head up Relieving factors: getting up and walking around  PRECAUTIONS: None  RED FLAGS: None     WEIGHT BEARING RESTRICTIONS: No  FALLS:  Has patient fallen in last 6 months? No  OCCUPATION: Psychologist, forensic retired  PLOF: Independent and Independent with basic ADLs  PATIENT GOALS: To decreased intensity and frequency of headaches, to get better posture  NEXT MD VISIT: March   OBJECTIVE:  Note: Objective measures were completed at Evaluation unless otherwise noted.  DIAGNOSTIC FINDINGS:  CLINICAL DATA:  Neck pain for 1 year, no known injury, initial encounter   EXAM: CERVICAL SPINE - COMPLETE 4+ VIEW   COMPARISON:  None Available.   FINDINGS: Seven cervical segments are well visualized. Vertebral body height is well maintained. Multilevel osteophytic changes are seen. Neural foramina show mild narrowing bilaterally. The odontoid is within normal limits. No prevertebral soft tissue abnormality is seen.   IMPRESSION: Degenerative changes with mild bilateral neural foraminal narrowing.  PATIENT SURVEYS:  NDI: TBA  COGNITION: Overall cognitive status: Within functional limits for tasks assessed  SENSATION: WFL  POSTURE: rounded shoulders and forward head  PALPATION: Tenderness to bilateral UT musculature   CERVICAL ROM:   Active ROM A/PROM (deg) eval  Flexion 35, crepitus reported  Extension 45, pull in left upper trap  Right lateral flexion 30  Left lateral flexion 30  Right rotation 70  Left rotation 64   (Blank rows = not tested)  UPPER EXTREMITY ROM:  Active ROM Right eval Left eval  Shoulder flexion    Shoulder extension    Shoulder abduction    Shoulder adduction    Shoulder extension    Shoulder internal rotation    Shoulder external rotation    Elbow flexion    Elbow extension    Wrist flexion    Wrist extension    Wrist ulnar deviation    Wrist radial deviation    Wrist pronation    Wrist supination      (Blank rows = not tested)  UPPER EXTREMITY MMT:  MMT Right eval Left eval  Shoulder flexion    Shoulder extension    Shoulder abduction    Shoulder adduction    Shoulder extension    Shoulder internal rotation    Shoulder external rotation    Middle trapezius 3+ 3+  Lower trapezius 3- 3-  Elbow flexion  Elbow extension    Wrist flexion    Wrist extension    Wrist ulnar deviation    Wrist radial deviation    Wrist pronation    Wrist supination    Grip strength     (Blank rows = not tested)  CERVICAL SPECIAL TESTS:  None performed this date    TREATMENT DATE:  02/02/24: UBE L1 2' forward/ 2' backwards  Seated:  3D thoracic excursion 5x 5  W back 10x 5  UT stretch 2x 30  RTB Rows 10x 5 Manual in supine position with LE elevated:  -UT, PROM, traction and suboccipital release    01/30/24 Review of HEP and goals NDI 5/50 10% Moist heat x 5' to decrease muscle tightness Cervical retraction x 10 Scapular retraction 5 x 10 Upper trap stretching 20 hold x 3 each Cervical rotation stretch with towel 10 x 3 each STM to bilateral upper traps x 10' to decrease pain and spasm; no other interventions performed with manual     01/27/2024  Evaluation: -ROM measured, Strength assessed, HEP prescribed, pt educated on prognosis, findings, and importance of HEP compliance if given.                                                                                                                                   PATIENT EDUCATION:  Education details: Pt was educated on findings of PT evaluation, prognosis, frequency of therapy visits and rationale, attendance policy, and HEP if given.   Person educated: Patient Education method: Explanation, Verbal cues, and Handouts Education comprehension: verbalized understanding, verbal cues required, and needs further education  HOME EXERCISE PROGRAM: Access Code: [facility] URL: [email or web address] Date:  01/30/2024 - Seated Assisted Cervical Rotation with Towel  - 1 x daily - 7 x weekly - 1 sets - 3 reps - 10 sec hold Access Code: [facility] URL: [email or web address] Date: 01/27/2024 Prepared by: Lang Ada  Exercises - Seated Scapular Retraction  - 1 x daily - 7 x weekly - 3 sets - 10 reps - 3 hold - Seated Upper Trapezius Stretch  - 1 x daily - 7 x weekly - 1 sets - 3 reps - 20 hold - Seated Cervical Retraction  - 1 x daily - 7 x weekly - 3 sets - 10 reps - 3 hold  ASSESSMENT:  CLINICAL IMPRESSION: Began with UBE for postural endurance training.  Progressed postural and cervical mobility with new exercises that was tolerated well.  Added 3D thoracic excursions and theraband resistance HEP with printout given and verbalized understanding.  EOS with manual with noted moderate spasm Lt UT, able to reduce though unable to fully resolve.  Encouraged pt to stay hydrated to reduce risk of headache following manual.  Eval: Patient is a 73 y.o. 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   Patient demonstrates decreased cervical spine  ROM, increased pain in neck, and increased tension in bilateral upper trapezius and paraspinals of cervical spine. Patient also suffering from increased frequency and intensity of headaches for the past 2 months. Patient also demonstrates tenderness to upper thoracic spinal segments to T3 vertebrae. Patient requires education on origin of cervicogenic headaches and why headaches seem to come on later in the day. Pt also educated on importance of corrective posturing as this is thought to be the main cause of pts symptoms in light of the degeneration that is present in pts cervical spine. Patient would benefit from skilled physical therapy for decreased headache frequency and intensity, increased strength in paraspinal and other postural musculature, and  improved posture for improved ability to work without symptom reproduction, return to higher level of function with ADLs, and progress towards therapy goals.    OBJECTIVE IMPAIRMENTS: decreased activity tolerance, decreased ROM, decreased strength, hypomobility, and pain.   ACTIVITY LIMITATIONS: lifting, dressing, and reach over head  PARTICIPATION LIMITATIONS: meal prep, cleaning, laundry, shopping, community activity, and yard work  PERSONAL FACTORS: Age, Past/current experiences, and Time since onset of injury/illness/exacerbation are also affecting patient's functional outcome.   REHAB POTENTIAL: Good  CLINICAL DECISION MAKING: Stable/uncomplicated  EVALUATION COMPLEXITY: Low   GOALS: Goals reviewed with patient? No  SHORT TERM GOALS: Target date: 02/17/24  Pt will be independent with HEP in order to demonstrate participation in Physical Therapy POC.  Baseline: Goal status: IN PROGRESS  2.  Pt will report 5/10 headache pain at worst with cervical mobility in order to demonstrate improved pain with ADLs and decreased intensity of headaches.  Baseline:  Goal status: IN PROGRESS  LONG TERM GOALS: Target date: 03/09/24  Pt will report decreased cervicogenic caused headaches to less than 2 per week in order to demonstrate improved quality of life.  Baseline: see eval subjective for pt recordings.  Goal status: IN PROGRESS  2.  Pt will improve cervical ROM (flex/ext/lateral flexion/rotation) by 15 degrees combined in order to demonstrate improved functional ambulatory capacity in community setting.  Baseline: see objective.  Goal status: IN PROGRESS  3.  Pt will improve NDI score by at least 11.75 points in order to demonstrate decreased pain with functional goals and outcomes. Baseline: see objective.  Goal status: IN PROGRESS  4.  Pt will report 3/10 pain with cervical mobility in order to demonstrate reduced pain with ADLs that require use of cervical spine musculature  (driving, washing hair, reaching to elevated cabinet).  Baseline: see objective.  Goal status: IN PROGRESS     PLAN:  PT FREQUENCY: 1-2x/week  PT DURATION: 6 weeks  PLANNED INTERVENTIONS: 97110-Therapeutic exercises, 97530- Therapeutic activity, [id number]- Neuromuscular re-education, 97535- Self Care, [id number]- Manual therapy, Spinal mobilization, Cryotherapy, and Moist heat  PLAN FOR NEXT SESSION:  progress postural musculature endurance and strength of cervical paraspinals and scapular musculature.  Added shoulder extension with theraband next session.   Augustin Mclean, LPTA/CLT; CBIS [phone]  12:04 PM, 02/02/24

## 2024-02-04 ENCOUNTER — Encounter (HOSPITAL_COMMUNITY)

## 2024-02-04 ENCOUNTER — Encounter (HOSPITAL_COMMUNITY): Payer: Self-pay

## 2024-02-06 ENCOUNTER — Ambulatory Visit

## 2024-02-06 VITALS — BP 129/77 | Ht 62.0 in | Wt 165.0 lb

## 2024-02-06 DIAGNOSIS — Z780 Asymptomatic menopausal state: Secondary | ICD-10-CM

## 2024-02-06 DIAGNOSIS — Z2821 Immunization not carried out because of patient refusal: Secondary | ICD-10-CM

## 2024-02-06 DIAGNOSIS — Z0000 Encounter for general adult medical examination without abnormal findings: Secondary | ICD-10-CM | POA: Diagnosis not present

## 2024-02-06 DIAGNOSIS — Z1231 Encounter for screening mammogram for malignant neoplasm of breast: Secondary | ICD-10-CM

## 2024-02-06 NOTE — Patient Instructions (Signed)
 Paige Cole,  Thank you for taking the time for your Medicare Wellness Visit. I appreciate your continued commitment to your health goals. Please review the care plan we discussed, and feel free to reach out if I can assist you further.  Medicare recommends these wellness visits once per year to help you and your care team stay ahead of potential health issues. These visits are designed to focus on prevention, allowing your provider to concentrate on managing your acute and chronic conditions during your regular appointments.  Please note that Annual Wellness Visits do not include a physical exam. Some assessments may be limited, especially if the visit was conducted virtually. If needed, we may recommend a separate in-person follow-up with your provider.  Ongoing Care Seeing your primary care provider every 3 to 6 months helps us  monitor your health and provide consistent, personalized care.   Referrals If a referral was made during today's visit and you haven't received any updates within two weeks, please contact the referred provider directly to check on the status.  To Schedule Your Mammogram and Osteoporosis Screening, Call: [facility] @ Phone: [phone]   Recommended Screenings:  Health Maintenance  Topic Date Due   DEXA scan (bone density measurement)  Never done   DTaP/Tdap/Td vaccine (1 - Tdap) Never done   Breast Cancer Screening  10/06/2021   Flu Shot  12/19/2023   Medicare Annual Wellness Visit  02/05/2025   Colon Cancer Screening  01/17/2027   HPV Vaccine  Aged Out   Meningitis B Vaccine  Aged Out   Pneumococcal Vaccine for age over 37  Discontinued   COVID-19 Vaccine  Discontinued   Hepatitis C Screening  Discontinued   Zoster (Shingles) Vaccine  Discontinued       02/06/2024    8:02 AM  Advanced Directives  Does Patient Have a Medical Advance Directive? Yes  Type of Estate agent of [place];Living will  Copy of Healthcare Power  of Attorney in Chart? No - copy requested  Would patient like information on creating a medical advance directive? No - Patient declined   Advance Care Planning is important because it: Ensures you receive medical care that aligns with your values, goals, and preferences. Provides guidance to your family and loved ones, reducing the emotional burden of decision-making during critical moments.  Vision: Annual vision screenings are recommended for early detection of glaucoma, cataracts, and diabetic retinopathy. These exams can also reveal signs of chronic conditions such as diabetes and high blood pressure.  Dental: Annual dental screenings help detect early signs of oral cancer, gum disease, and other conditions linked to overall health, including heart disease and diabetes.  Please see the attached documents for additional preventive care recommendations.

## 2024-02-06 NOTE — Progress Notes (Signed)
 Subjective:   Paige Cole is a 73 y.o. who presents for a Medicare Wellness preventive visit.  As a reminder, Annual Wellness Visits don't include a physical exam, and some assessments may be limited, especially if this visit is performed virtually. We may recommend an in-person follow-up visit with your provider if needed.  Visit Complete: Virtual I connected with  Paige Cole on 02/06/24 by a audio enabled telemedicine application and verified that I am speaking with the correct person using two identifiers.  Patient Location: Home  Provider Location: Home Office  I discussed the limitations of evaluation and management by telemedicine. The patient expressed understanding and agreed to proceed.  Vital Signs: Because this visit was a virtual/telehealth visit, some criteria may be missing or patient reported. Any vitals not documented were not able to be obtained and vitals that have been documented are patient reported.  VideoDeclined- This patient declined Librarian, academic. Therefore the visit was completed with audio only.  Persons Participating in Visit: Patient.  AWV Questionnaire: Yes: Patient Medicare AWV questionnaire was completed by the patient on 01/31/2024; I have confirmed that all information answered by patient is correct and no changes since this date.  Cardiac Risk Factors include: advanced age (>13men, >58 women);obesity (BMI >30kg/m2)     Objective:    Today's Vitals   02/06/24 0812  BP: 129/77  Weight: 165 lb (74.8 kg)  Height: 5' 2 (1.575 m)   Body mass index is 30.18 kg/m.     02/06/2024    8:02 AM 01/27/2024    8:49 AM 08/11/2023    8:19 AM 03/26/2023    7:15 AM 01/21/2023    2:52 PM 09/30/2022   12:53 PM 01/16/2022    6:59 AM  Advanced Directives  Does Patient Have a Medical Advance Directive? Yes Yes Yes Yes Yes Yes Yes  Type of Estate agent of [place];Living will  Living will;Healthcare Power  of Attorney Living will Healthcare Power of [place];Living will Healthcare Power of [place];Living will;Out of facility DNR (pink MOST or yellow form) Living will;Healthcare Power of Attorney  Does patient want to make changes to medical advance directive?  No - Patient declined       Copy of Healthcare Power of Attorney in Chart? No - copy requested      No - copy requested  Would patient like information on creating a medical advance directive? No - Patient declined          Current Medications (verified) Outpatient Encounter Medications as of 02/06/2024  Medication Sig   aspirin  EC (ADULT ASPIRIN  REGIMEN) 81 MG tablet Take 81 mg by mouth once. (Patient not taking: Reported on 02/06/2024)   cefdinir  (OMNICEF ) 300 MG capsule Take 1 capsule (300 mg total) by mouth 2 (two) times daily.   pantoprazole  (PROTONIX ) 40 MG tablet TAKE 1 TABLET BY MOUTH EVERY DAY (Patient not taking: Reported on 12/11/2023)   promethazine -dextromethorphan (PROMETHAZINE -DM) 6.25-15 MG/5ML syrup Take 5 mLs by mouth 4 (four) times daily as needed for cough.   No facility-administered encounter medications on file as of 02/06/2024.    Allergies (verified) Penicillins   History: Past Medical History:  Diagnosis Date   Arrhythmia 09/18/22   Stroke (HCC) 09/18/22   Past Surgical History:  Procedure Laterality Date   ABDOMINAL HYSTERECTOMY     COLONOSCOPY N/A 06/08/2018   Procedure: COLONOSCOPY;  Surgeon: Golda Claudis PENNER, MD;  [facility];  Service: Endoscopy;  Laterality: N/A;  7:30  COLONOSCOPY WITH PROPOFOL  N/A 01/16/2022   Procedure: COLONOSCOPY WITH PROPOFOL ;  Surgeon: Eartha Angelia Sieving, MD;  [facility];  Service: Gastroenterology;  Laterality: N/A;  730 ASA 2   ESOPHAGOGASTRODUODENOSCOPY (EGD) WITH PROPOFOL  N/A 03/26/2023   Procedure: ESOPHAGOGASTRODUODENOSCOPY (EGD) WITH PROPOFOL ;  Surgeon: Eartha Angelia Sieving, MD;  [facility];  Service: Gastroenterology;   Laterality: N/A;  8:45AM;ASA 1   POLYPECTOMY  06/08/2018   Procedure: POLYPECTOMY;  Surgeon: Golda Claudis PENNER, MD;  [facility];  Service: Endoscopy;;  cold snare [place] x 1, [place] x1   POLYPECTOMY  01/16/2022   Procedure: POLYPECTOMY;  Surgeon: Eartha Angelia Sieving, MD;  [facility];  Service: Gastroenterology;;   Family History  Problem Relation Age of Onset   Congestive Heart Failure Father    Heart disease Father    Breast cancer Niece    Social History   Socioeconomic History   Marital status: Married    Spouse name: Not on file   Number of children: Not on file   Years of education: Not on file   Highest education level: 12th grade  Occupational History   Not on file  Tobacco Use   Smoking status: Never    Passive exposure: Past   Smokeless tobacco: Never  Vaping Use   Vaping status: Never Used  Substance and Sexual Activity   Alcohol use: Never   Drug use: Never   Sexual activity: Not Currently    Birth control/protection: None  Other Topics Concern   Not on file  Social History Narrative   Are you right handed or left handed? Right    Are you currently employed ? Retired    What is your current occupation?   Do you live at home alone? No    Who lives with you? Family    What type of home do you live in: 1 story or 2 story?  Steps to basement        Social Drivers of Health   Financial Resource Strain: Low Risk  (01/31/2024)   Overall Financial Resource Strain (CARDIA)    Difficulty of Paying Living Expenses: Not hard at all  Food Insecurity: No Food Insecurity (01/31/2024)   Hunger Vital Sign    Worried About Running Out of Food in the Last Year: Never true    Ran Out of Food in the Last Year: Never true  Transportation Needs: No Transportation Needs (01/31/2024)   PRAPARE - Administrator, Civil Service (Medical): No    Lack of Transportation (Non-Medical): No  Physical Activity: Insufficiently Active (01/31/2024)    Exercise Vital Sign    Days of Exercise per Week: 3 days    Minutes of Exercise per Session: 30 min  Stress: No Stress Concern Present (01/31/2024)   [employer] of Occupational Health - Occupational Stress Questionnaire    Feeling of Stress: Not at all  Social Connections: Socially Integrated (01/31/2024)   Social Connection and Isolation Panel    Frequency of Communication with Friends and Family: More than three times a week    Frequency of Social Gatherings with Friends and Family: Once a week    Attends Religious Services: More than 4 times per year    Active Member of [employer] or Organizations: Yes    Attends Engineer, structural: More than 4 times per year    Marital Status: Married    Tobacco Counseling Counseling given: Yes    Clinical Intake:  Pre-visit preparation completed: Yes  Pain : No/denies pain     BMI - recorded: 30.18 Nutritional Status: BMI > 30  Obese Nutritional Risks: None Diabetes: No  Lab Results  Component Value Date   HGBA1C 5.8 (H) 09/23/2022     How often do you need to have someone help you when you read instructions, pamphlets, or other written materials from your doctor or pharmacy?: 1 - Never  Interpreter Needed?: No  Information entered by :: Yazen Rosko W CMA (AAMA)   Activities of Daily Living     01/31/2024    9:14 AM  In your present state of health, do you have any difficulty performing the following activities:  Hearing? 0  Vision? 0  Difficulty concentrating or making decisions? 0  Walking or climbing stairs? 0  Dressing or bathing? 0  Doing errands, shopping? 0  Preparing Food and eating ? N  Using the Toilet? N  In the past six months, have you accidently leaked urine? N  Do you have problems with loss of bowel control? N  Managing your Medications? N  Managing your Finances? N  Housekeeping or managing your Housekeeping? N    Patient Care Team: Cook, Jayce G, DO as PCP - General (Family  Medicine) Georjean Darice HERO, MD as Consulting Physician (Neurology) Stacia Diannah SQUIBB, MD as Consulting Physician (Cardiology) [facility], [facility] (Optometry) Alm HERO [facility], [facility] as Consulting Physician (Ophthalmology)  I have updated your Care Teams any recent Medical Services you may have received from other providers in the past year.     Assessment:   This is a routine wellness examination for Paige Cole.  Hearing/Vision screen Hearing Screening - Comments:: Patient denies any hearing difficulties.   Vision Screening - Comments:: Wears rx glasses - up to date with routine eye exams with  [facility] in [place]   Goals Addressed               This Visit's Progress     Patient Stated (pt-stated)        I want to remain active and healthy        Depression Screen     02/06/2024    8:15 AM 08/22/2023   10:05 AM 09/20/2022    4:07 PM 10/30/2021    9:17 AM  PHQ 2/9 Scores  PHQ - 2 Score 0 0 0 0  PHQ- 9 Score 0 0 2      Fall Risk     01/31/2024    9:14 AM 08/22/2023   10:05 AM 08/11/2023    8:19 AM 01/21/2023    2:52 PM 09/30/2022   12:53 PM  Fall Risk   Falls in the past year? 0 0 0 0 0  Number falls in past yr: 0  0 0 0  Injury with Fall? 0  0 0 0  Risk for fall due to : No Fall Risks      Follow up Falls evaluation completed;Education provided;Falls prevention discussed  Falls evaluation completed Falls evaluation completed Falls evaluation completed    MEDICARE RISK AT HOME:  Medicare Risk at Home Any stairs in or around the home?: (Patient-Rptd) Yes If so, are there any without handrails?: (Patient-Rptd) No Home free of loose throw rugs in walkways, pet beds, electrical cords, etc?: (Patient-Rptd) Yes Adequate lighting in your home to reduce risk of falls?: (Patient-Rptd) Yes Life alert?: (Patient-Rptd) No Use of a cane, walker or w/c?: (Patient-Rptd) No Grab bars in the  bathroom?: (Patient-Rptd) No Shower chair or bench in shower?:  (Patient-Rptd) No Elevated toilet seat or a handicapped toilet?: (Patient-Rptd) Yes  TIMED UP AND GO:  Was the test performed?  No  Cognitive Function: 6CIT completed        02/06/2024    8:18 AM  6CIT Screen  What Year? 0 points  What month? 0 points  What time? 0 points  Count back from 20 0 points  Months in reverse 0 points  Repeat phrase 0 points  Total Score 0 points    Immunizations  There is no immunization history on file for this patient.  Screening Tests Health Maintenance  Topic Date Due   DEXA SCAN  Never done   DTaP/Tdap/Td (1 - Tdap) Never done   Mammogram  10/06/2021   Influenza Vaccine  12/19/2023   Medicare Annual Wellness (AWV)  02/05/2025   Colonoscopy  01/17/2027   HPV VACCINES  Aged Out   Meningococcal B Vaccine  Aged Out   Pneumococcal Vaccine: 50+ Years  Discontinued   COVID-19 Vaccine  Discontinued   Hepatitis C Screening  Discontinued   Zoster Vaccines- Shingrix  Discontinued    Health Maintenance Health Maintenance Due  Topic Date Due   DEXA SCAN  Never done   DTaP/Tdap/Td (1 - Tdap) Never done   Mammogram  10/06/2021   Influenza Vaccine  12/19/2023   Health Maintenance Items Addressed: Mammogram ordered, DEXA ordered  Additional Screening:  Vision Screening: Recommended annual ophthalmology exams for early detection of glaucoma and other disorders of the eye. Would you like a referral to an eye doctor? No    Dental Screening: Recommended annual dental exams for proper oral hygiene  Community Resource Referral / Chronic Care Management: CRR required this visit?  No   CCM required this visit?  No   Plan:    I have personally reviewed and noted the following in the patient's chart:   Medical and social history Use of alcohol, tobacco or illicit drugs  Current medications and supplements including opioid prescriptions. Patient is not currently taking opioid prescriptions. Functional ability and status Nutritional  status Physical activity Advanced directives List of other physicians Hospitalizations, surgeries, and ER visits in previous 12 months Vitals Screenings to include cognitive, depression, and falls Referrals and appointments  In addition, I have reviewed and discussed with patient certain preventive protocols, quality metrics, and best practice recommendations. A written personalized care plan for preventive services as well as general preventive health recommendations were provided to patient.   Shishir Krantz, CMA   02/06/2024   After Visit Summary: (MyChart) Due to this being a telephonic visit, the after visit summary with patients personalized plan was offered to patient via MyChart   Notes: Nothing significant to report at this time.

## 2024-02-09 ENCOUNTER — Encounter (HOSPITAL_COMMUNITY)

## 2024-02-11 ENCOUNTER — Encounter (HOSPITAL_COMMUNITY)

## 2024-02-16 ENCOUNTER — Ambulatory Visit (HOSPITAL_COMMUNITY): Admitting: Physical Therapy

## 2024-02-16 DIAGNOSIS — M542 Cervicalgia: Secondary | ICD-10-CM | POA: Diagnosis not present

## 2024-02-16 DIAGNOSIS — G4486 Cervicogenic headache: Secondary | ICD-10-CM

## 2024-02-16 NOTE — Therapy (Signed)
 OUTPATIENT PHYSICAL THERAPY CERVICAL TREATMENT   Patient Name: Paige Cole MRN: [id number] DOB:09-Aug-1950, 73 y.o., female Today's Date: 02/16/2024  END OF SESSION:  PT End of Session - 02/16/24 1401     Visit Number 4    Number of Visits 12    Date for Recertification  03/09/24    Authorization Type [employer]    Authorization Time Period no auth required    Progress Note Due on Visit 10    PT Start Time 0733    PT Stop Time 0815    PT Time Calculation (min) 42 min    Activity Tolerance Patient tolerated treatment well    Behavior During Therapy [facility] for tasks assessed/performed            Past Medical History:  Diagnosis Date   Arrhythmia 09/18/22   Stroke (HCC) 09/18/22   Past Surgical History:  Procedure Laterality Date   ABDOMINAL HYSTERECTOMY     COLONOSCOPY N/A 06/08/2018   Procedure: COLONOSCOPY;  Surgeon: Golda Claudis PENNER, MD;  [facility];  Service: Endoscopy;  Laterality: N/A;  7:30   COLONOSCOPY WITH PROPOFOL  N/A 01/16/2022   Procedure: COLONOSCOPY WITH PROPOFOL ;  Surgeon: Eartha Angelia Sieving, MD;  [facility];  Service: Gastroenterology;  Laterality: N/A;  730 ASA 2   ESOPHAGOGASTRODUODENOSCOPY (EGD) WITH PROPOFOL  N/A 03/26/2023   Procedure: ESOPHAGOGASTRODUODENOSCOPY (EGD) WITH PROPOFOL ;  Surgeon: Eartha Angelia Sieving, MD;  [facility];  Service: Gastroenterology;  Laterality: N/A;  8:45AM;ASA 1   POLYPECTOMY  06/08/2018   Procedure: POLYPECTOMY;  Surgeon: Golda Claudis PENNER, MD;  [facility];  Service: Endoscopy;;  cold snare [place] x 1, [place] x1   POLYPECTOMY  01/16/2022   Procedure: POLYPECTOMY;  Surgeon: Eartha Angelia Sieving, MD;  [facility];  Service: Gastroenterology;;   Patient Active Problem List   Diagnosis Date Noted   Respiratory infection 08/24/2023   GERD (gastroesophageal reflux disease) 03/06/2023   NSVT (nonsustained ventricular tachycardia) (HCC) 11/27/2022    Non-cardiac chest pain 11/25/2022   Palpitations 11/25/2022   Prediabetes 11/18/2022   Hyperlipidemia 11/18/2022   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   Obesity (BMI 30-39.9) 11/29/2021   History of diverticulitis 05/24/2019    PCP: Bluford Jacqulyn MATSU, DO   REFERRING PROVIDER: Georjean Darice HERO, MD  REFERRING DIAG: M54.2 (ICD-10-CM) - Neck pain G44.86 (ICD-10-CM) - Cervicogenic headache  THERAPY DIAG:  Cervicogenic headache  Cervicalgia  Rationale for Evaluation and Treatment: Rehabilitation  ONSET DATE: May of 24, told she had a stroke and shortly after that the headaches started, lesion in brain dissipated  SUBJECTIVE:  SUBJECTIVE STATEMENT: 02/16/24: Pt just returned from week long beach trip. States she had no pain all week.  Did have a headache yesterday but quickly went away (was 5/10 after taking a nap).  Overall improving.    Eval: Pt states she was told she had a stroke in 2024 and started having neck pain and headaches after that, states the stroke lesion has since dissipated. Pt has kept a written record of the bad headaches, July 23, 25, 29, 31; Aug 5, 6, 7, 8, 15; Sept 6, 8. Pt has found that walking helps as well as just getting up and moving around.  Pt states that self mobilizations and STM of paraspinals has helped some. Pt states no pain in the arms, denies any numbness or tingling in arms or hands. Pt states the headaches tend to start in the afternoon. Pt states last one was a small one yesterday afternoon.   Hand dominance: Right  PERTINENT HISTORY:  None reported  PAIN:  Are you having pain? Yes: NPRS scale: 5/10  Pain location: base of skull scalp Pain description: headache Aggravating  factors: afternoon, laying with pillows pushing head up Relieving factors: getting up and walking around  PRECAUTIONS: None  RED FLAGS: None     WEIGHT BEARING RESTRICTIONS: No  FALLS:  Has patient fallen in last 6 months? No  OCCUPATION: Psychologist, forensic retired  PLOF: Independent and Independent with basic ADLs  PATIENT GOALS: To decreased intensity and frequency of headaches, to get better posture  NEXT MD VISIT: March   OBJECTIVE:  Note: Objective measures were completed at Evaluation unless otherwise noted.  DIAGNOSTIC FINDINGS:  CLINICAL DATA:  Neck pain for 1 year, no known injury, initial encounter   EXAM: CERVICAL SPINE - COMPLETE 4+ VIEW   COMPARISON:  None Available.   FINDINGS: Seven cervical segments are well visualized. Vertebral body height is well maintained. Multilevel osteophytic changes are seen. Neural foramina show mild narrowing bilaterally. The odontoid is within normal limits. No prevertebral soft tissue abnormality is seen.   IMPRESSION: Degenerative changes with mild bilateral neural foraminal narrowing.  PATIENT SURVEYS:  NDI: TBA  COGNITION: Overall cognitive status: Within functional limits for tasks assessed  SENSATION: WFL  POSTURE: rounded shoulders and forward head  PALPATION: Tenderness to bilateral UT musculature   CERVICAL ROM:   Active ROM A/PROM (deg) eval  Flexion 35, crepitus reported  Extension 45, pull in left upper trap  Right lateral flexion 30  Left lateral flexion 30  Right rotation 70  Left rotation 64   (Blank rows = not tested)  UPPER EXTREMITY ROM:  Active ROM Right eval Left eval  Shoulder flexion    Shoulder extension    Shoulder abduction    Shoulder adduction    Shoulder extension    Shoulder internal rotation    Shoulder external rotation    Elbow flexion    Elbow extension    Wrist flexion    Wrist extension    Wrist ulnar deviation    Wrist radial deviation    Wrist pronation     Wrist supination     (Blank rows = not tested)  UPPER EXTREMITY MMT:  MMT Right eval Left eval  Shoulder flexion    Shoulder extension    Shoulder abduction    Shoulder adduction    Shoulder extension    Shoulder internal rotation    Shoulder external rotation    Middle trapezius 3+ 3+  Lower trapezius 3- 3-  Elbow flexion  Elbow extension    Wrist flexion    Wrist extension    Wrist ulnar deviation    Wrist radial deviation    Wrist pronation    Wrist supination    Grip strength     (Blank rows = not tested)  CERVICAL SPECIAL TESTS:  None performed this date    TREATMENT DATE:  02/16/24: UBE L1 2' forward/ 2' backwards  Standing:  RTB rows 2X10  RTB shoulder extension 2X10 Seated:  3D thoracic excursion matrix with overhead reach 5x 5 each  W back 10x 5 Manual in supine position with LE elevated:  -UT, PROM, traction and suboccipital release   02/02/24: UBE L1 2' forward/ 2' backwards  Seated:  3D thoracic excursion 5x 5  W back 10x 5  UT stretch 2x 30  RTB Rows 10x 5 Manual in supine position with LE elevated:  -UT, PROM, traction and suboccipital release   01/30/24 Review of HEP and goals NDI 5/50 10% Moist heat x 5' to decrease muscle tightness Cervical retraction x 10 Scapular retraction 5 x 10 Upper trap stretching 20 hold x 3 each Cervical rotation stretch with towel 10 x 3 each STM to bilateral upper traps x 10' to decrease pain and spasm; no other interventions performed with manual  01/27/2024  Evaluation: -ROM measured, Strength assessed, HEP prescribed, pt educated on prognosis, findings, and importance of HEP compliance if given.                                                                                                                        PATIENT EDUCATION:  Education details: Pt was educated on findings of PT evaluation, prognosis, frequency of therapy visits and rationale, attendance policy, and HEP if given.    Person educated: Patient Education method: Explanation, Verbal cues, and Handouts Education comprehension: verbalized understanding, verbal cues required, and needs further education  HOME EXERCISE PROGRAM: Access Code: [facility] URL: [email or web address] Date: 01/30/2024 - Seated Assisted Cervical Rotation with Towel  - 1 x daily - 7 x weekly - 1 sets - 3 reps - 10 sec hold  Access Code: [facility] URL: [email or web address] Date: 01/27/2024 Prepared by: Lang Ada Exercises - Seated Scapular Retraction  - 1 x daily - 7 x weekly - 3 sets - 10 reps - 3 hold - Seated Upper Trapezius Stretch  - 1 x daily - 7 x weekly - 1 sets - 3 reps - 20 hold - Seated Cervical Retraction  - 1 x daily - 7 x weekly - 3 sets - 10 reps - 3 hold  02/16/24:  theraband rows and shoulder extension red band, 2X day 10 reps  Thoracic matrix with overhead reaches, 2X day, 5 repetitions each  ASSESSMENT:  CLINICAL IMPRESSION: Began with UBE for dynamic warmup.  Continued with postural theraband exercises with addition of shoulder extension.  Updated HEP to include this as well as thoracic matrix that was added today as well.  Pt reported positive results from addition  of this activity.  Continued with manual including distraction, occipital release and soft tissue mobilization.  Minimal tightness palpated today.  Cues to full relax cervical muscular with releases.  Pt reported no pain during or at end of session today. Patient will continue to  benefit from skilled physical therapy for decreased headache frequency and intensity, increased strength in paraspinal and other postural musculature, and improved posture for improved ability to work without symptom reproduction, return to higher level of function with ADLs, and progress towards therapy goals.    OBJECTIVE IMPAIRMENTS: decreased activity tolerance, decreased ROM, decreased strength, hypomobility, and pain.   ACTIVITY  LIMITATIONS: lifting, dressing, and reach over head  PARTICIPATION LIMITATIONS: meal prep, cleaning, laundry, shopping, community activity, and yard work  PERSONAL FACTORS: Age, Past/current experiences, and Time since onset of injury/illness/exacerbation are also affecting patient's functional outcome.   REHAB POTENTIAL: Good  CLINICAL DECISION MAKING: Stable/uncomplicated  EVALUATION COMPLEXITY: Low   GOALS: Goals reviewed with patient? No  SHORT TERM GOALS: Target date: 02/17/24  Pt will be independent with HEP in order to demonstrate participation in Physical Therapy POC.  Baseline: Goal status: IN PROGRESS  2.  Pt will report 5/10 headache pain at worst with cervical mobility in order to demonstrate improved pain with ADLs and decreased intensity of headaches.  Baseline:  Goal status: IN PROGRESS  LONG TERM GOALS: Target date: 03/09/24  Pt will report decreased cervicogenic caused headaches to less than 2 per week in order to demonstrate improved quality of life.  Baseline: see eval subjective for pt recordings.  Goal status: IN PROGRESS  2.  Pt will improve cervical ROM (flex/ext/lateral flexion/rotation) by 15 degrees combined in order to demonstrate improved functional ambulatory capacity in community setting.  Baseline: see objective.  Goal status: IN PROGRESS  3.  Pt will improve NDI score by at least 11.75 points in order to demonstrate decreased pain with functional goals and outcomes. Baseline: see objective.  Goal status: IN PROGRESS  4.  Pt will report 3/10 pain with cervical mobility in order to demonstrate reduced pain with ADLs that require use of cervical spine musculature (driving, washing hair, reaching to elevated cabinet).  Baseline: see objective.  Goal status: IN PROGRESS     PLAN:  PT FREQUENCY: 1-2x/week  PT DURATION: 6 weeks  PLANNED INTERVENTIONS: 97110-Therapeutic exercises, 97530- Therapeutic activity, [id number]- Neuromuscular  re-education, 97535- Self Care, [id number]- Manual therapy, Spinal mobilization, Cryotherapy, and Moist heat  PLAN FOR NEXT SESSION:  progress postural musculature endurance and strength of cervical paraspinals and scapular musculature.    Greig KATHEE Fuse, PTA/CLT [facility] Ph: [phone]  2:01 PM, 02/16/24

## 2024-02-18 ENCOUNTER — Ambulatory Visit (HOSPITAL_COMMUNITY): Attending: Neurology

## 2024-02-18 ENCOUNTER — Encounter (HOSPITAL_COMMUNITY): Payer: Self-pay

## 2024-02-18 DIAGNOSIS — G4486 Cervicogenic headache: Secondary | ICD-10-CM | POA: Diagnosis not present

## 2024-02-18 DIAGNOSIS — M542 Cervicalgia: Secondary | ICD-10-CM | POA: Insufficient documentation

## 2024-02-18 NOTE — Therapy (Signed)
 OUTPATIENT PHYSICAL THERAPY CERVICAL TREATMENT   Patient Name: Paige Cole MRN: [id number] DOB:1951-04-06, 73 y.o., female Today's Date: 02/18/2024  END OF SESSION:  PT End of Session - 02/18/24 0729     Visit Number 5    Number of Visits 12    Date for Recertification  03/09/24    Authorization Type [employer]    Authorization Time Period no auth required    PT Start Time 0729    PT Stop Time 0810    PT Time Calculation (min) 41 min    Activity Tolerance Patient tolerated treatment well    Behavior During Therapy [facility] for tasks assessed/performed             Past Medical History:  Diagnosis Date   Arrhythmia 09/18/22   Stroke (HCC) 09/18/22   Past Surgical History:  Procedure Laterality Date   ABDOMINAL HYSTERECTOMY     COLONOSCOPY N/A 06/08/2018   Procedure: COLONOSCOPY;  Surgeon: Golda Claudis PENNER, MD;  [facility];  Service: Endoscopy;  Laterality: N/A;  7:30   COLONOSCOPY WITH PROPOFOL  N/A 01/16/2022   Procedure: COLONOSCOPY WITH PROPOFOL ;  Surgeon: Eartha Angelia Sieving, MD;  [facility];  Service: Gastroenterology;  Laterality: N/A;  730 ASA 2   ESOPHAGOGASTRODUODENOSCOPY (EGD) WITH PROPOFOL  N/A 03/26/2023   Procedure: ESOPHAGOGASTRODUODENOSCOPY (EGD) WITH PROPOFOL ;  Surgeon: Eartha Angelia Sieving, MD;  [facility];  Service: Gastroenterology;  Laterality: N/A;  8:45AM;ASA 1   POLYPECTOMY  06/08/2018   Procedure: POLYPECTOMY;  Surgeon: Golda Claudis PENNER, MD;  [facility];  Service: Endoscopy;;  cold snare [place] x 1, [place] x1   POLYPECTOMY  01/16/2022   Procedure: POLYPECTOMY;  Surgeon: Eartha Angelia Sieving, MD;  [facility];  Service: Gastroenterology;;   Patient Active Problem List   Diagnosis Date Noted   Respiratory infection 08/24/2023   GERD (gastroesophageal reflux disease) 03/06/2023   NSVT (nonsustained ventricular tachycardia) (HCC) 11/27/2022   Non-cardiac chest pain 11/25/2022    Palpitations 11/25/2022   Prediabetes 11/18/2022   Hyperlipidemia 11/18/2022   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   Obesity (BMI 30-39.9) 11/29/2021   History of diverticulitis 05/24/2019    PCP: Bluford Jacqulyn MATSU, DO   REFERRING PROVIDER: Georjean Darice HERO, MD  REFERRING DIAG: M54.2 (ICD-10-CM) - Neck pain G44.86 (ICD-10-CM) - Cervicogenic headache  THERAPY DIAG:  Cervicogenic headache  Cervicalgia  Rationale for Evaluation and Treatment: Rehabilitation  ONSET DATE: May of 24, told she had a stroke and shortly after that the headaches started, lesion in brain dissipated  SUBJECTIVE:  SUBJECTIVE STATEMENT: Pt reports no neck pain or headaches this morning. Pt states neck feels much looser and can turn much better. Pt states she seems to only have headaches following naps, normally sleeps on her side for about 45 minutes to and hour.    Eval: Pt states she was told she had a stroke in 2024 and started having neck pain and headaches after that, states the stroke lesion has since dissipated. Pt has kept a written record of the bad headaches, July 23, 25, 29, 31; Aug 5, 6, 7, 8, 15; Sept 6, 8. Pt has found that walking helps as well as just getting up and moving around.  Pt states that self mobilizations and STM of paraspinals has helped some. Pt states no pain in the arms, denies any numbness or tingling in arms or hands. Pt states the headaches tend to start in the afternoon. Pt states last one was a small one yesterday afternoon.   Hand dominance: Right  PERTINENT HISTORY:  None reported  PAIN:  Are you having pain? Yes: NPRS scale: 5/10  Pain location: base of skull scalp Pain description: headache Aggravating  factors: afternoon, laying with pillows pushing head up Relieving factors: getting up and walking around  PRECAUTIONS: None  RED FLAGS: None     WEIGHT BEARING RESTRICTIONS: No  FALLS:  Has patient fallen in last 6 months? No  OCCUPATION: Psychologist, forensic retired  PLOF: Independent and Independent with basic ADLs  PATIENT GOALS: To decreased intensity and frequency of headaches, to get better posture  NEXT MD VISIT: March   OBJECTIVE:  Note: Objective measures were completed at Evaluation unless otherwise noted.  DIAGNOSTIC FINDINGS:  CLINICAL DATA:  Neck pain for 1 year, no known injury, initial encounter   EXAM: CERVICAL SPINE - COMPLETE 4+ VIEW   COMPARISON:  None Available.   FINDINGS: Seven cervical segments are well visualized. Vertebral body height is well maintained. Multilevel osteophytic changes are seen. Neural foramina show mild narrowing bilaterally. The odontoid is within normal limits. No prevertebral soft tissue abnormality is seen.   IMPRESSION: Degenerative changes with mild bilateral neural foraminal narrowing.  PATIENT SURVEYS:  NDI: TBA  COGNITION: Overall cognitive status: Within functional limits for tasks assessed  SENSATION: WFL  POSTURE: rounded shoulders and forward head  PALPATION: Tenderness to bilateral UT musculature   CERVICAL ROM:   Active ROM A/PROM (deg) eval  Flexion 35, crepitus reported  Extension 45, pull in left upper trap  Right lateral flexion 30  Left lateral flexion 30  Right rotation 70  Left rotation 64   (Blank rows = not tested)  UPPER EXTREMITY ROM:  Active ROM Right eval Left eval  Shoulder flexion    Shoulder extension    Shoulder abduction    Shoulder adduction    Shoulder extension    Shoulder internal rotation    Shoulder external rotation    Elbow flexion    Elbow extension    Wrist flexion    Wrist extension    Wrist ulnar deviation    Wrist radial deviation    Wrist pronation     Wrist supination     (Blank rows = not tested)  UPPER EXTREMITY MMT:  MMT Right eval Left eval  Shoulder flexion    Shoulder extension    Shoulder abduction    Shoulder adduction    Shoulder extension    Shoulder internal rotation    Shoulder external rotation    Middle trapezius 3+ 3+  Lower trapezius 3- 3-  Elbow flexion    Elbow extension    Wrist flexion    Wrist extension    Wrist ulnar deviation    Wrist radial deviation    Wrist pronation    Wrist supination    Grip strength     (Blank rows = not tested)  CERVICAL SPECIAL TESTS:  None performed this date    TREATMENT DATE:  02/18/2024  Therapeutic Exercise: -UBE, 4 minutes, 2 fwd, 2 bwd, pt cued for over 60 SPM, level one resistance -Lat pull down, plate 3>4, pt cued for tucking elbows, 2 sets of 10 reps -Shoulder Rows, GTB at chest level, 2 sets of 10 reps, pt cued for scapular retraction -Shoulder extensions, GTB at chest level, 2 sets of 10 reps, pt cued for scapular retraction -D1/D2 shoulder flexions, 1 set of 10 reps, yellow theraband on lowest setting, pt cued for proper form, keeping shoulders square -Seated cervical rotation snag, 1 set of 10 reps, bilaterally, pt cued for proper sequencing and form with UE -Thread the needle stretch, 1 set of 4 reps bilaterally -Ball roll up wall with cervical rotations and push up, 1 set of 8 reps Neuromuscular Re-education: -Seated chin tucks, 1 set of 10 reps, 3 seconds, pt cued for hold and ROM -Scapular retraction isometric paired with cervical AROM (flexion/extension and L/R lateral flexion), 1 set of 5 reps   02/16/24: UBE L1 2' forward/ 2' backwards  Standing:  RTB rows 2X10  RTB shoulder extension 2X10 Seated:  3D thoracic excursion matrix with overhead reach 5x 5 each  W back 10x 5 Manual in supine position with LE elevated:  -UT, PROM, traction and suboccipital release   02/02/24: UBE L1 2' forward/ 2' backwards  Seated:  3D thoracic  excursion 5x 5  W back 10x 5  UT stretch 2x 30  RTB Rows 10x 5 Manual in supine position with LE elevated:  -UT, PROM, traction and suboccipital release                                                                                                                         PATIENT EDUCATION:  Education details: Pt was educated on findings of PT evaluation, prognosis, frequency of therapy visits and rationale, attendance policy, and HEP if given.   Person educated: Patient Education method: Explanation, Verbal cues, and Handouts Education comprehension: verbalized understanding, verbal cues required, and needs further education  HOME EXERCISE PROGRAM: Access Code: [facility] URL: [email or web address] Date: 01/30/2024 - Seated Assisted Cervical Rotation with Towel  - 1 x daily - 7 x weekly - 1 sets - 3 reps - 10 sec hold  Access Code: [facility] URL: [email or web address] Date: 01/27/2024 Prepared by: Lang Ada Exercises - Seated Scapular Retraction  - 1 x daily - 7 x weekly - 3 sets - 10 reps - 3 hold - Seated Upper Trapezius Stretch  - 1 x daily - 7 x weekly - 1 sets - 3 reps -  20 hold - Seated Cervical Retraction  - 1 x daily - 7 x weekly - 3 sets - 10 reps - 3 hold  02/16/24:  theraband rows and shoulder extension red band, 2X day 10 reps  Thoracic matrix with overhead reaches, 2X day, 5 repetitions each  ASSESSMENT:  CLINICAL IMPRESSION: Patient continues to demonstrate decreased cervical pain, decreased intensity of headaches, improved UE/postural strength, and improved cervical ROM. Patient also demonstrates good endurance with aerobic based exercise during today's session. Patient able to progress dynamic balance and cervical spine activation exercises today with thread the needle stretch and push up variations, good performance with verbal cueing. Patient would continue to benefit from skilled physical therapy for increased endurance with  cervical spine mobility, increased UE/scapular strength, and improved cervical mobility for improved quality of life, decreased head ache intensity/frequency and continued progress towards therapy goals.     OBJECTIVE IMPAIRMENTS: decreased activity tolerance, decreased ROM, decreased strength, hypomobility, and pain.   ACTIVITY LIMITATIONS: lifting, dressing, and reach over head  PARTICIPATION LIMITATIONS: meal prep, cleaning, laundry, shopping, community activity, and yard work  PERSONAL FACTORS: Age, Past/current experiences, and Time since onset of injury/illness/exacerbation are also affecting patient's functional outcome.   REHAB POTENTIAL: Good  CLINICAL DECISION MAKING: Stable/uncomplicated  EVALUATION COMPLEXITY: Low   GOALS: Goals reviewed with patient? No  SHORT TERM GOALS: Target date: 02/17/24  Pt will be independent with HEP in order to demonstrate participation in Physical Therapy POC.  Baseline: Goal status: IN PROGRESS  2.  Pt will report 5/10 headache pain at worst with cervical mobility in order to demonstrate improved pain with ADLs and decreased intensity of headaches.  Baseline:  Goal status: IN PROGRESS  LONG TERM GOALS: Target date: 03/09/24  Pt will report decreased cervicogenic caused headaches to less than 2 per week in order to demonstrate improved quality of life.  Baseline: see eval subjective for pt recordings.  Goal status: IN PROGRESS  2.  Pt will improve cervical ROM (flex/ext/lateral flexion/rotation) by 15 degrees combined in order to demonstrate improved functional ambulatory capacity in community setting.  Baseline: see objective.  Goal status: IN PROGRESS  3.  Pt will improve NDI score by at least 11.75 points in order to demonstrate decreased pain with functional goals and outcomes. Baseline: see objective.  Goal status: IN PROGRESS  4.  Pt will report 3/10 pain with cervical mobility in order to demonstrate reduced pain with ADLs  that require use of cervical spine musculature (driving, washing hair, reaching to elevated cabinet).  Baseline: see objective.  Goal status: IN PROGRESS     PLAN:  PT FREQUENCY: 1-2x/week  PT DURATION: 6 weeks  PLANNED INTERVENTIONS: 97110-Therapeutic exercises, 97530- Therapeutic activity, [id number]- Neuromuscular re-education, 97535- Self Care, [id number]- Manual therapy, Spinal mobilization, Cryotherapy, and Moist heat  PLAN FOR NEXT SESSION:  progress postural musculature endurance and strength of cervical paraspinals and scapular musculature.    Lang Ada, PT, DPT [facility] Office: [phone] 8:17 AM, 02/18/24

## 2024-02-23 ENCOUNTER — Ambulatory Visit (HOSPITAL_COMMUNITY): Admitting: Physical Therapy

## 2024-02-23 DIAGNOSIS — G4486 Cervicogenic headache: Secondary | ICD-10-CM | POA: Diagnosis not present

## 2024-02-23 DIAGNOSIS — M542 Cervicalgia: Secondary | ICD-10-CM

## 2024-02-23 NOTE — Therapy (Signed)
 OUTPATIENT PHYSICAL THERAPY CERVICAL TREATMENT   Patient Name: Paige Cole MRN: [id number] DOB:06/27/1950, 73 y.o., female Today's Date: 02/23/2024  END OF SESSION:  PT End of Session - 02/23/24 1003     Visit Number 6    Number of Visits 12    Date for Recertification  03/09/24    Authorization Type [employer]    Authorization Time Period no auth required    PT Start Time 0904    PT Stop Time 0946    PT Time Calculation (min) 42 min    Activity Tolerance Patient tolerated treatment well    Behavior During Therapy [facility] for tasks assessed/performed              Past Medical History:  Diagnosis Date   Arrhythmia 09/18/22   Stroke (HCC) 09/18/22   Past Surgical History:  Procedure Laterality Date   ABDOMINAL HYSTERECTOMY     COLONOSCOPY N/A 06/08/2018   Procedure: COLONOSCOPY;  Surgeon: Golda Claudis PENNER, MD;  [facility];  Service: Endoscopy;  Laterality: N/A;  7:30   COLONOSCOPY WITH PROPOFOL  N/A 01/16/2022   Procedure: COLONOSCOPY WITH PROPOFOL ;  Surgeon: Eartha Angelia Sieving, MD;  Location: [facility];  Service: Gastroenterology;  Laterality: N/A;  730 ASA 2   ESOPHAGOGASTRODUODENOSCOPY (EGD) WITH PROPOFOL  N/A 03/26/2023   Procedure: ESOPHAGOGASTRODUODENOSCOPY (EGD) WITH PROPOFOL ;  Surgeon: Eartha Angelia Sieving, MD;  [facility];  Service: Gastroenterology;  Laterality: N/A;  8:45AM;ASA 1   POLYPECTOMY  06/08/2018   Procedure: POLYPECTOMY;  Surgeon: Golda Claudis PENNER, MD;  [facility]: [facility];  Service: Endoscopy;;  cold snare [place] x 1, DC x1   POLYPECTOMY  01/16/2022   Procedure: POLYPECTOMY;  Surgeon: Eartha Angelia Sieving, MD;  [facility]: [facility];  Service: Gastroenterology;;   Patient Active Problem List   Diagnosis Date Noted   Respiratory infection 08/24/2023   GERD (gastroesophageal reflux disease) 03/06/2023   NSVT (nonsustained ventricular tachycardia) (HCC) 11/27/2022   Non-cardiac chest pain  11/25/2022   Palpitations 11/25/2022   Prediabetes 11/18/2022   Hyperlipidemia 11/18/2022   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   Obesity (BMI 30-39.9) 11/29/2021   History of diverticulitis 05/24/2019    PCP: Bluford Jacqulyn MATSU, DO   REFERRING PROVIDER: Georjean Darice HERO, MD  REFERRING DIAG: M54.2 (ICD-10-CM) - Neck pain G44.86 (ICD-10-CM) - Cervicogenic headache  THERAPY DIAG:  Cervicogenic headache  Cervicalgia  Rationale for Evaluation and Treatment: Rehabilitation  ONSET DATE: May of 24, told she had a stroke and shortly after that the headaches started, lesion in brain dissipated  SUBJECTIVE:  SUBJECTIVE STATEMENT: Pt reports a little soreness following last session from added exercises but no pain. Does report a headache Sunday evening after walking from taking a nap.  No neck pain or headaches this morning.  Pt reports she is really getting better overall with only having HA 1X week for a couple of hours (goes away without taking pain meds).  States she gets fatigue when standing cooking/cutting potatoes so thinks these exercises will really help.  Eval: Pt states she was told she had a stroke in 2024 and started having neck pain and headaches after that, states the stroke lesion has since dissipated. Pt has kept a written record of the bad headaches, July 23, 25, 29, 31; Aug 5, 6, 7, 8, 15; Sept 6, 8. Pt has found that walking helps as well as just getting up and moving around.  Pt states that self mobilizations and STM of paraspinals has helped some. Pt states no pain in the arms, denies any numbness or tingling in arms or hands. Pt states the headaches tend to start in the afternoon. Pt states last one was a small  one yesterday afternoon.   Hand dominance: Right  PERTINENT HISTORY:  None reported  PAIN:  Are you having pain? No  PRECAUTIONS: None  RED FLAGS: None     WEIGHT BEARING RESTRICTIONS: No  FALLS:  Has patient fallen in last 6 months? No  OCCUPATION: Psychologist, forensic retired  PLOF: Independent and Independent with basic ADLs  PATIENT GOALS: To decreased intensity and frequency of headaches, to get better posture  NEXT MD VISIT: March   OBJECTIVE:  Note: Objective measures were completed at Evaluation unless otherwise noted.  DIAGNOSTIC FINDINGS:  CLINICAL DATA:  Neck pain for 1 year, no known injury, initial encounter   EXAM: CERVICAL SPINE - COMPLETE 4+ VIEW   COMPARISON:  None Available.   FINDINGS: Seven cervical segments are well visualized. Vertebral body height is well maintained. Multilevel osteophytic changes are seen. Neural foramina show mild narrowing bilaterally. The odontoid is within normal limits. No prevertebral soft tissue abnormality is seen.   IMPRESSION: Degenerative changes with mild bilateral neural foraminal narrowing.  PATIENT SURVEYS:  NDI: TBA  COGNITION: Overall cognitive status: Within functional limits for tasks assessed  SENSATION: WFL  POSTURE: rounded shoulders and forward head  PALPATION: Tenderness to bilateral UT musculature   CERVICAL ROM:   Active ROM A/PROM (deg) eval  Flexion 35, crepitus reported  Extension 45, pull in left upper trap  Right lateral flexion 30  Left lateral flexion 30  Right rotation 70  Left rotation 64   (Blank rows = not tested)  UPPER EXTREMITY ROM:  Active ROM Right eval Left eval  Shoulder flexion    Shoulder extension    Shoulder abduction    Shoulder adduction    Shoulder extension    Shoulder internal rotation    Shoulder external rotation    Elbow flexion    Elbow extension    Wrist flexion    Wrist extension    Wrist ulnar deviation    Wrist radial deviation     Wrist pronation    Wrist supination     (Blank rows = not tested)  UPPER EXTREMITY MMT:  MMT Right eval Left eval  Shoulder flexion    Shoulder extension    Shoulder abduction    Shoulder adduction    Shoulder extension    Shoulder internal rotation    Shoulder external rotation    Middle trapezius  3+ 3+  Lower trapezius 3- 3-  Elbow flexion    Elbow extension    Wrist flexion    Wrist extension    Wrist ulnar deviation    Wrist radial deviation    Wrist pronation    Wrist supination    Grip strength     (Blank rows = not tested)  CERVICAL SPECIAL TESTS:  None performed this date    TREATMENT DATE:  02/23/2024  UBE, 4 minutes, 2 fwd, 2 bwd, pt cued for over 60 SPM, level one resistance Standing: Green physioball wall push up and roll up with cervical rotations 5X each direction  Shoulder Rows, GTB at chest level, 2 sets of 10 reps, pt cued for scapular retraction Shoulder extensions, GTB at chest level, 2 sets of 10 reps, pt cued for scapular retraction D1/D2 shoulder flexions, 2 set of 10 reps, yellow theraband, pt cued for proper form, keeping shoulders square Doorway stretch2X30 1# DB lateral raises 10X 1# DB forward flex with core stab 10X Seated: Bodycraft lat pull down, plate 4, 2 sets of 10 reps  Scapular retraction 2PL 2X10 Thread the needle stretch shown in standing for secondary alternative   02/18/2024  Therapeutic Exercise: -UBE, 4 minutes, 2 fwd, 2 bwd, pt cued for over 60 SPM, level one resistance -Lat pull down, plate 3>4, pt cued for tucking elbows, 2 sets of 10 reps -Shoulder Rows, GTB at chest level, 2 sets of 10 reps, pt cued for scapular retraction -Shoulder extensions, GTB at chest level, 2 sets of 10 reps, pt cued for scapular retraction -D1/D2 shoulder flexions, 1 set of 10 reps, yellow theraband on lowest setting, pt cued for proper form, keeping shoulders square -Seated cervical rotation snag, 1 set of 10 reps, bilaterally, pt cued for  proper sequencing and form with UE -Thread the needle stretch, 1 set of 4 reps bilaterally -Ball roll up wall with cervical rotations and push up, 1 set of 8 reps Neuromuscular Re-education: -Seated chin tucks, 1 set of 10 reps, 3 seconds, pt cued for hold and ROM -Scapular retraction isometric paired with cervical AROM (flexion/extension and L/R lateral flexion), 1 set of 5 reps   02/16/24: UBE L1 2' forward/ 2' backwards  Standing:  RTB rows 2X10  RTB shoulder extension 2X10 Seated:  3D thoracic excursion matrix with overhead reach 5x 5 each  W back 10x 5 Manual in supine position with LE elevated:  -UT, PROM, traction and suboccipital release   02/02/24: UBE L1 2' forward/ 2' backwards  Seated:  3D thoracic excursion 5x 5  W back 10x 5  UT stretch 2x 30  RTB Rows 10x 5 Manual in supine position with LE elevated:  -UT, PROM, traction and suboccipital release                                                                                                                         PATIENT EDUCATION:  Education details: Pt was educated on findings of  PT evaluation, prognosis, frequency of therapy visits and rationale, attendance policy, and HEP if given.   Person educated: Patient Education method: Explanation, Verbal cues, and Handouts Education comprehension: verbalized understanding, verbal cues required, and needs further education  HOME EXERCISE PROGRAM: Access Code: [facility] URL: [email or web address] Date: 01/30/2024 - Seated Assisted Cervical Rotation with Towel  - 1 x daily - 7 x weekly - 1 sets - 3 reps - 10 sec hold  Access Code: [facility] URL: [email or web address] Date: 01/27/2024 Prepared by: Lang Ada Exercises - Seated Scapular Retraction  - 1 x daily - 7 x weekly - 3 sets - 10 reps - 3 hold - Seated Upper Trapezius Stretch  - 1 x daily - 7 x weekly - 1 sets - 3 reps - 20 hold - Seated Cervical Retraction  - 1 x daily - 7  x weekly - 3 sets - 10 reps - 3 hold  02/16/24:  theraband rows and shoulder extension red band, 2X day 10 reps  Thoracic matrix with overhead reaches, 2X day, 5 repetitions each  ASSESSMENT:  CLINICAL IMPRESSION: Continued with focus on improving UE and cervical mm strength/activity tolerance to help reduce pain and increase indurance.  Pt shown alternative way of completing thread the needle activity against wall or using doorway as reported discomfort in all fours to her knees.  Pt states fatigue in upper cervical and shoulder mm when preparing meals so added upper trap DB strengthening with good form.  Also began scapular retraction using bodycraft machine. Verbal cues needed for form but overall stabilized well and had no pain with activities. Patient will continue to benefit from skilled physical therapy for increased endurance with cervical spine mobility, increased UE/scapular strength, and improved cervical mobility for improved quality of life, decreased head ache intensity/frequency and continued progress towards therapy goals.     OBJECTIVE IMPAIRMENTS: decreased activity tolerance, decreased ROM, decreased strength, hypomobility, and pain.   ACTIVITY LIMITATIONS: lifting, dressing, and reach over head  PARTICIPATION LIMITATIONS: meal prep, cleaning, laundry, shopping, community activity, and yard work  PERSONAL FACTORS: Age, Past/current experiences, and Time since onset of injury/illness/exacerbation are also affecting patient's functional outcome.   REHAB POTENTIAL: Good  CLINICAL DECISION MAKING: Stable/uncomplicated  EVALUATION COMPLEXITY: Low   GOALS: Goals reviewed with patient? No  SHORT TERM GOALS: Target date: 02/17/24  Pt will be independent with HEP in order to demonstrate participation in Physical Therapy POC.  Baseline: Goal status: IN PROGRESS  2.  Pt will report 5/10 headache pain at worst with cervical mobility in order to demonstrate improved pain  with ADLs and decreased intensity of headaches.  Baseline:  Goal status: IN PROGRESS  LONG TERM GOALS: Target date: 03/09/24  Pt will report decreased cervicogenic caused headaches to less than 2 per week in order to demonstrate improved quality of life.  Baseline: see eval subjective for pt recordings.  Goal status: IN PROGRESS  2.  Pt will improve cervical ROM (flex/ext/lateral flexion/rotation) by 15 degrees combined in order to demonstrate improved functional ambulatory capacity in community setting.  Baseline: see objective.  Goal status: IN PROGRESS  3.  Pt will improve NDI score by at least 11.75 points in order to demonstrate decreased pain with functional goals and outcomes. Baseline: see objective.  Goal status: IN PROGRESS  4.  Pt will report 3/10 pain with cervical mobility in order to demonstrate reduced pain with ADLs that require use of cervical spine musculature (driving, washing hair, reaching to elevated cabinet).  Baseline: see objective.  Goal status: IN PROGRESS     PLAN:  PT FREQUENCY: 1-2x/week  PT DURATION: 6 weeks  PLANNED INTERVENTIONS: 97110-Therapeutic exercises, 97530- Therapeutic activity, [id number]- Neuromuscular re-education, 97535- Self Care, [id number]- Manual therapy, Spinal mobilization, Cryotherapy, and Moist heat  PLAN FOR NEXT SESSION:  progress postural musculature endurance and strength of cervical paraspinals and scapular musculature.  Update HEP next session as only coming 1X weekly.  Greig KATHEE Fuse, PTA/CLT [facility] Ph: [phone] 10:04 AM, 02/23/24

## 2024-02-25 ENCOUNTER — Encounter (HOSPITAL_COMMUNITY)

## 2024-02-27 ENCOUNTER — Ambulatory Visit (HOSPITAL_COMMUNITY)
Admission: RE | Admit: 2024-02-27 | Discharge: 2024-02-27 | Disposition: A | Discharge status: Home / Self Care | Source: Ambulatory Visit | Attending: Family Medicine | Admitting: Family Medicine

## 2024-02-27 DIAGNOSIS — Z780 Asymptomatic menopausal state: Secondary | ICD-10-CM | POA: Diagnosis not present

## 2024-02-27 DIAGNOSIS — Z1231 Encounter for screening mammogram for malignant neoplasm of breast: Secondary | ICD-10-CM | POA: Insufficient documentation

## 2024-02-29 ENCOUNTER — Ambulatory Visit: Payer: Self-pay | Admitting: Family Medicine

## 2024-03-03 ENCOUNTER — Ambulatory Visit (HOSPITAL_COMMUNITY)

## 2024-03-03 ENCOUNTER — Encounter (INDEPENDENT_AMBULATORY_CARE_PROVIDER_SITE_OTHER): Payer: Self-pay | Admitting: Gastroenterology

## 2024-03-03 ENCOUNTER — Encounter (HOSPITAL_COMMUNITY): Payer: Self-pay

## 2024-03-03 DIAGNOSIS — G4486 Cervicogenic headache: Secondary | ICD-10-CM | POA: Diagnosis not present

## 2024-03-03 DIAGNOSIS — M542 Cervicalgia: Secondary | ICD-10-CM

## 2024-03-03 NOTE — Therapy (Signed)
 OUTPATIENT PHYSICAL THERAPY CERVICAL TREATMENT   Patient Name: Paige Cole MRN: [id number] DOB:[birth year], 73 y.o., female Today's Date: 03/03/2024  END OF SESSION:  PT End of Session - 03/03/24 0731     Visit Number 7    Number of Visits 12    Date for Recertification  03/09/24    Authorization Type [employer]    Authorization Time Period no auth required    Progress Note Due on Visit 10    PT Start Time 0731    PT Stop Time 0810    PT Time Calculation (min) 39 min    Activity Tolerance Patient tolerated treatment well    Behavior During Therapy [facility] for tasks assessed/performed               Past Medical History:  Diagnosis Date   Arrhythmia 09/18/22   Stroke (HCC) 09/18/22   Past Surgical History:  Procedure Laterality Date   ABDOMINAL HYSTERECTOMY     COLONOSCOPY N/A 06/08/2018   Procedure: COLONOSCOPY;  Surgeon: Golda Claudis PENNER, MD;  [facility];  Service: Endoscopy;  Laterality: N/A;  7:30   COLONOSCOPY WITH PROPOFOL  N/A 01/16/2022   Procedure: COLONOSCOPY WITH PROPOFOL ;  Surgeon: Eartha Angelia Sieving, MD;  [facility];  Service: Gastroenterology;  Laterality: N/A;  730 ASA 2   ESOPHAGOGASTRODUODENOSCOPY (EGD) WITH PROPOFOL  N/A 03/26/2023   Procedure: ESOPHAGOGASTRODUODENOSCOPY (EGD) WITH PROPOFOL ;  Surgeon: Eartha Angelia Sieving, MD;  [facility];  Service: Gastroenterology;  Laterality: N/A;  8:45AM;ASA 1   POLYPECTOMY  06/08/2018   Procedure: POLYPECTOMY;  Surgeon: Golda Claudis PENNER, MD;  [facility];  Service: Endoscopy;;  cold snare [place] x 1, DC x1   POLYPECTOMY  01/16/2022   Procedure: POLYPECTOMY;  Surgeon: Eartha Angelia Sieving, MD;  [facility];  Service: Gastroenterology;;   Patient Active Problem List   Diagnosis Date Noted   Respiratory infection 08/24/2023   GERD (gastroesophageal reflux disease) 03/06/2023   NSVT (nonsustained ventricular tachycardia) (HCC)  11/27/2022   Non-cardiac chest pain 11/25/2022   Palpitations 11/25/2022   Prediabetes 11/18/2022   Hyperlipidemia 11/18/2022   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   Obesity (BMI 30-39.9) 11/29/2021   History of diverticulitis 05/24/2019    PCP: Bluford Jacqulyn MATSU, DO   REFERRING PROVIDER: Georjean Darice HERO, MD  REFERRING DIAG: M54.2 (ICD-10-CM) - Neck pain G44.86 (ICD-10-CM) - Cervicogenic headache  THERAPY DIAG:  Cervicogenic headache  Cervicalgia  Rationale for Evaluation and Treatment: Rehabilitation  ONSET DATE: May of 24, told she had a stroke and shortly after that the headaches started, lesion in brain dissipated  SUBJECTIVE:  SUBJECTIVE STATEMENT: Pt states she did have one headache last Wednesday and had to take tylenol  to get it to ease off. Pt states she does not have any headache or neck pain this morning.   Eval: Pt states she was told she had a stroke in 2024 and started having neck pain and headaches after that, states the stroke lesion has since dissipated. Pt has kept a written record of the bad headaches, July 23, 25, 29, 31; Aug 5, 6, 7, 8, 15; Sept 6, 8. Pt has found that walking helps as well as just getting up and moving around.  Pt states that self mobilizations and STM of paraspinals has helped some. Pt states no pain in the arms, denies any numbness or tingling in arms or hands. Pt states the headaches tend to start in the afternoon. Pt states last one was a small one yesterday afternoon.   Hand dominance: Right  PERTINENT HISTORY:  None reported  PAIN:  Are you having pain? No  PRECAUTIONS: None  RED FLAGS: None     WEIGHT BEARING RESTRICTIONS: No  FALLS:  Has patient fallen in  last 6 months? No  OCCUPATION: Psychologist, forensic retired  PLOF: Independent and Independent with basic ADLs  PATIENT GOALS: To decreased intensity and frequency of headaches, to get better posture  NEXT MD VISIT: March   OBJECTIVE:  Note: Objective measures were completed at Evaluation unless otherwise noted.  DIAGNOSTIC FINDINGS:  CLINICAL DATA:  Neck pain for 1 year, no known injury, initial encounter   EXAM: CERVICAL SPINE - COMPLETE 4+ VIEW   COMPARISON:  None Available.   FINDINGS: Seven cervical segments are well visualized. Vertebral body height is well maintained. Multilevel osteophytic changes are seen. Neural foramina show mild narrowing bilaterally. The odontoid is within normal limits. No prevertebral soft tissue abnormality is seen.   IMPRESSION: Degenerative changes with mild bilateral neural foraminal narrowing.  PATIENT SURVEYS:  NDI: TBA  COGNITION: Overall cognitive status: Within functional limits for tasks assessed  SENSATION: WFL  POSTURE: rounded shoulders and forward head  PALPATION: Tenderness to bilateral UT musculature   CERVICAL ROM:   Active ROM A/PROM (deg) eval  Flexion 35, crepitus reported  Extension 45, pull in left upper trap  Right lateral flexion 30  Left lateral flexion 30  Right rotation 70  Left rotation 64   (Blank rows = not tested)  UPPER EXTREMITY ROM:  Active ROM Right eval Left eval  Shoulder flexion    Shoulder extension    Shoulder abduction    Shoulder adduction    Shoulder extension    Shoulder internal rotation    Shoulder external rotation    Elbow flexion    Elbow extension    Wrist flexion    Wrist extension    Wrist ulnar deviation    Wrist radial deviation    Wrist pronation    Wrist supination     (Blank rows = not tested)  UPPER EXTREMITY MMT:  MMT Right eval Left eval  Shoulder flexion    Shoulder extension    Shoulder abduction    Shoulder adduction    Shoulder extension     Shoulder internal rotation    Shoulder external rotation    Middle trapezius 3+ 3+  Lower trapezius 3- 3-  Elbow flexion    Elbow extension    Wrist flexion    Wrist extension    Wrist ulnar deviation    Wrist radial deviation    Wrist pronation  Wrist supination    Grip strength     (Blank rows = not tested)  CERVICAL SPECIAL TESTS:  None performed this date    TREATMENT DATE:  03/03/2024  Therapeutic Exercise: -UBE, 4 minutes, 2 fwd, 2 bwd, pt cued for over 60 SPM, level one resistance -Lat pull down, plate 4, pt cued for tucking elbows, 2 sets of 10 reps -Shoulder Rows, BTB at chest level, 2 sets of 10 reps, pt cued for scapular retraction -Shoulder extensions, BTB at chest level, 2 sets of 10 reps, pt cued for scapular retraction -Kettle bell swing to overhead, 2 sets of 8 reps, pt cued for controlled movement and neutral cervical spine throughout.  -D2 shoulder flexions, 1 set of 6 reps, body craft on lowest setting, plate 2, pt cued for proper form, keeping shoulders square -Seated cervical rotation snag, 1 set of 10 reps, bilaterally, pt cued for proper sequencing and form with UE -Thread the needle stretch, 1 set of 10 reps bilaterally -Cat cow stretch with cervical spine activation (flexion/extension), 1 set of 10 reps -Ball roll up wall with cervical rotations (at bottom and top) and push up, 1 set of 8 reps    02/23/2024  UBE, 4 minutes, 2 fwd, 2 bwd, pt cued for over 60 SPM, level one resistance Standing: Green physioball wall push up and roll up with cervical rotations 5X each direction  Shoulder Rows, GTB at chest level, 2 sets of 10 reps, pt cued for scapular retraction Shoulder extensions, GTB at chest level, 2 sets of 10 reps, pt cued for scapular retraction D1/D2 shoulder flexions, 2 set of 10 reps, yellow theraband, pt cued for proper form, keeping shoulders square Doorway stretch2X30 1# DB lateral raises 10X 1# DB forward flex with core stab  10X Seated: Bodycraft lat pull down, plate 4, 2 sets of 10 reps  Scapular retraction 2PL 2X10 Thread the needle stretch shown in standing for secondary alternative   02/18/2024  Therapeutic Exercise: -UBE, 4 minutes, 2 fwd, 2 bwd, pt cued for over 60 SPM, level one resistance -Lat pull down, plate 3>4, pt cued for tucking elbows, 2 sets of 10 reps -Shoulder Rows, GTB at chest level, 2 sets of 10 reps, pt cued for scapular retraction -Shoulder extensions, GTB at chest level, 2 sets of 10 reps, pt cued for scapular retraction -D1/D2 shoulder flexions, 1 set of 10 reps, yellow theraband on lowest setting, pt cued for proper form, keeping shoulders square -Seated cervical rotation snag, 1 set of 10 reps, bilaterally, pt cued for proper sequencing and form with UE -Thread the needle stretch, 1 set of 4 reps bilaterally -Ball roll up wall with cervical rotations and push up, 1 set of 8 reps Neuromuscular Re-education: -Seated chin tucks, 1 set of 10 reps, 3 seconds, pt cued for hold and ROM -Scapular retraction isometric paired with cervical AROM (flexion/extension and L/R lateral flexion), 1 set of 5 reps  PATIENT EDUCATION:  Education details: Pt was educated on findings of PT evaluation, prognosis, frequency of therapy visits and rationale, attendance policy, and HEP if given.   Person educated: Patient Education method: Explanation, Verbal cues, and Handouts Education comprehension: verbalized understanding, verbal cues required, and needs further education  HOME EXERCISE PROGRAM: Access Code: [facility] URL: [email or web address] Date: 01/30/2024 - Seated Assisted Cervical Rotation with Towel  - 1 x daily - 7 x weekly - 1 sets - 3 reps - 10 sec hold  Access Code: [facility] URL: [email or web address] Date: 01/27/2024 Prepared by: Lang Ada Exercises - Seated Scapular Retraction  - 1 x daily - 7 x weekly - 3 sets - 10 reps - 3 hold - Seated Upper Trapezius Stretch  - 1 x daily - 7 x weekly - 1 sets - 3 reps - 20 hold - Seated Cervical Retraction  - 1 x daily - 7 x weekly - 3 sets - 10 reps - 3 hold  02/16/24:  theraband rows and shoulder extension red band, 2X day 10 reps  Thoracic matrix with overhead reaches, 2X day, 5 repetitions each  ASSESSMENT:  CLINICAL IMPRESSION: Patient continues to demonstrate decreased cervical neck pain and decreased intensity and frequency of headaches. Pt also has demonstrated increased activity tolerance with cervical spine mobility and postural strength movements. Patient able to progress dynamic balance and core activation exercises today with quadruped activities and resistance with familiar activities, good performance with verbal cueing. Patient would continue to benefit from skilled physical therapy for increased endurance with cervical mobility, increased postural strength, and improved functional mobility for improved quality of life, improved independence with management of neck pain and continued progress towards therapy goals. Probable discharge next session pending no relapses in symptoms.       OBJECTIVE IMPAIRMENTS: decreased activity tolerance, decreased ROM, decreased strength, hypomobility, and pain.   ACTIVITY LIMITATIONS: lifting, dressing, and reach over head  PARTICIPATION LIMITATIONS: meal prep, cleaning, laundry, shopping, community activity, and yard work  PERSONAL FACTORS: Age, Past/current experiences, and Time since onset of injury/illness/exacerbation are also affecting patient's functional outcome.   REHAB POTENTIAL: Good  CLINICAL DECISION MAKING: Stable/uncomplicated  EVALUATION COMPLEXITY: Low   GOALS: Goals reviewed with patient? No  SHORT TERM GOALS: Target date: 02/17/24  Pt will be independent with HEP in order to demonstrate participation in  Physical Therapy POC.  Baseline: Goal status: IN PROGRESS  2.  Pt will report 5/10 headache pain at worst with cervical mobility in order to demonstrate improved pain with ADLs and decreased intensity of headaches.  Baseline:  Goal status: IN PROGRESS  LONG TERM GOALS: Target date: 03/09/24  Pt will report decreased cervicogenic caused headaches to less than 2 per week in order to demonstrate improved quality of life.  Baseline: see eval subjective for pt recordings.  Goal status: IN PROGRESS  2.  Pt will improve cervical ROM (flex/ext/lateral flexion/rotation) by 15 degrees combined in order to demonstrate improved functional ambulatory capacity in community setting.  Baseline: see objective.  Goal status: IN PROGRESS  3.  Pt will improve NDI score by at least 11.75 points in order to demonstrate decreased pain with functional goals and outcomes. Baseline: see objective.  Goal status: IN PROGRESS  4.  Pt will report 3/10 pain with cervical mobility in order to demonstrate reduced pain with ADLs that require use of cervical spine musculature (driving, washing hair, reaching to elevated cabinet).  Baseline: see objective.  Goal status: IN PROGRESS     PLAN:  PT FREQUENCY: 1-2x/week  PT DURATION: 6 weeks  PLANNED INTERVENTIONS: 97110-Therapeutic exercises, 97530- Therapeutic activity, [id number]- Neuromuscular re-education, 97535- Self Care, [id number]- Manual therapy, Spinal mobilization, Cryotherapy, and Moist heat  PLAN FOR NEXT SESSION:  progress postural musculature endurance and strength of cervical paraspinals and scapular musculature.  Update HEP and probable discharge  Lang Ada, PT, DPT [facility] Office: [phone] 8:16 AM, 03/03/24

## 2024-03-08 ENCOUNTER — Encounter (INDEPENDENT_AMBULATORY_CARE_PROVIDER_SITE_OTHER): Payer: Self-pay | Admitting: Gastroenterology

## 2024-03-08 ENCOUNTER — Ambulatory Visit (INDEPENDENT_AMBULATORY_CARE_PROVIDER_SITE_OTHER): Payer: Medicare HMO | Admitting: Gastroenterology

## 2024-03-08 VITALS — BP 130/83 | HR 66 | Temp 97.3°F | Ht 62.0 in | Wt 170.2 lb

## 2024-03-08 DIAGNOSIS — K219 Gastro-esophageal reflux disease without esophagitis: Secondary | ICD-10-CM

## 2024-03-08 NOTE — Patient Instructions (Signed)
 Im glad you are doing well! Be mindful of spicy, fried, citrus foods, caffeine , carbonated drinks, chocolate and alcohol as these can increase reflux symptoms Stay upright 2-3 hours after eating, prior to lying down and avoid eating late in the evenings.  Colonoscopy due in 2028, we will send a reminder closer to due time  We will plan to see you as needed, let me know if you have any new or recurrent GI issues!!  It was a pleasure to see you today. I want to create trusting relationships with patients and provide genuine, compassionate, and quality care. I truly value your feedback! please be on the lookout for a survey regarding your visit with me today. I appreciate your input about our visit and your time in completing this!    Lucette Kratz L. Riyad Keena, MSN, APRN, AGNP-C Adult-Gerontology Nurse Practitioner [facility] Gastroenterology at [facility]

## 2024-03-08 NOTE — Progress Notes (Addendum)
 Referring Provider: Cook, Jayce G, DO Primary Care Physician:  Cook, Jayce G, DO Primary GI Physician: Dr. Eartha   Chief Complaint  Patient presents with   Follow-up    Pt arrives for follow up. Pt states things are going wonderful. No issues at this time. No GERD so pt has not been taking Protonix .    HPI:   Paige Cole is a 73 y.o. female with past medical history of diveritculitis, stroke, IBS and GERD   Patient presenting today for:  Follow up of GERD  Last seen October, by DR. Castaneda at that time, feeling much better, much less chest discomfort. Taking protonix  40mg  daily. Occasional abdominal cramping.   Recommended to continue PPI daily, schedule EGD  EGD in November 2024, normal  Present:  States that she quit taking PPI a while back, thinks this was before her EGD. She notes no further issues with chest pressure or heart palpitations. Has had no GERD symptoms. Appetite is good. No red flag symptoms. Patient denies melena, hematochezia, nausea, vomiting, diarrhea, constipation, dysphagia, odyonophagia, early satiety or weight loss.    Last EGD: as above  Last Colonoscopy 2023:  - Three 4 to 6 mm polyps in the sigmoid colon and in the transverse colon-3 tubular adenomas - Diverticulosis in the sigmoid colon, in the descending colon and in the ascending colon. - The distal rectum and anal verge are normal on retroflexion view.   Repeat in 5 years   Filed Weights   03/08/24 0851  Weight: 170 lb 3.2 oz (77.2 kg)     Past Medical History:  Diagnosis Date   Arrhythmia 09/18/22   Stroke (HCC) 09/18/22    Past Surgical History:  Procedure Laterality Date   ABDOMINAL HYSTERECTOMY     COLONOSCOPY N/A 06/08/2018   Procedure: COLONOSCOPY;  Surgeon: Golda Claudis PENNER, MD;  [facility];  Service: Endoscopy;  Laterality: N/A;  7:30   COLONOSCOPY WITH PROPOFOL  N/A 01/16/2022   Procedure: COLONOSCOPY WITH PROPOFOL ;  Surgeon: Eartha Angelia Sieving, MD;   [facility];  Service: Gastroenterology;  Laterality: N/A;  730 ASA 2   ESOPHAGOGASTRODUODENOSCOPY (EGD) WITH PROPOFOL  N/A 03/26/2023   Procedure: ESOPHAGOGASTRODUODENOSCOPY (EGD) WITH PROPOFOL ;  Surgeon: Eartha Angelia Sieving, MD;  [facility];  Service: Gastroenterology;  Laterality: N/A;  8:45AM;ASA 1   POLYPECTOMY  06/08/2018   Procedure: POLYPECTOMY;  Surgeon: Golda Claudis PENNER, MD;  [facility];  Service: Endoscopy;;  cold snare [place] x 1, DC x1   POLYPECTOMY  01/16/2022   Procedure: POLYPECTOMY;  Surgeon: Eartha Angelia, Sieving, MD;  [facility];  Service: Gastroenterology;;    Current Outpatient Medications  Medication Sig Dispense Refill   aspirin  EC (ADULT ASPIRIN  REGIMEN) 81 MG tablet Take 81 mg by mouth once. (Patient not taking: Reported on 03/08/2024)     pantoprazole  (PROTONIX ) 40 MG tablet TAKE 1 TABLET BY MOUTH EVERY DAY (Patient not taking: Reported on 03/08/2024) 60 tablet 2   No current facility-administered medications for this visit.    Allergies as of 03/08/2024 - Review Complete 03/08/2024  Allergen Reaction Noted   Penicillins Rash 04/03/2018    Social History   Socioeconomic History   Marital status: Married    Spouse name: Not on file   Number of children: Not on file   Years of education: Not on file   Highest education level: 12th grade  Occupational History   Not on file  Tobacco Use   Smoking  status: Never    Passive exposure: Past   Smokeless tobacco: Never  Vaping Use   Vaping status: Never Used  Substance and Sexual Activity   Alcohol use: Never   Drug use: Never   Sexual activity: Not Currently    Birth control/protection: None  Other Topics Concern   Not on file  Social History Narrative   Are you right handed or left handed? Right    Are you currently employed ? Retired    What is your current occupation?   Do you live at home alone? No    Who lives with you? Family    What type of  home do you live in: 1 story or 2 story?  Steps to basement        Social Drivers of Health   Financial Resource Strain: Low Risk  (01/31/2024)   Overall Financial Resource Strain (CARDIA)    Difficulty of Paying Living Expenses: Not hard at all  Food Insecurity: No Food Insecurity (01/31/2024)   Hunger Vital Sign    Worried About Running Out of Food in the Last Year: Never true    Ran Out of Food in the Last Year: Never true  Transportation Needs: No Transportation Needs (01/31/2024)   PRAPARE - Administrator, Civil Service (Medical): No    Lack of Transportation (Non-Medical): No  Physical Activity: Insufficiently Active (01/31/2024)   Exercise Vital Sign    Days of Exercise per Week: 3 days    Minutes of Exercise per Session: 30 min  Stress: No Stress Concern Present (01/31/2024)   [employer] of Occupational Health - Occupational Stress Questionnaire    Feeling of Stress: Not at all  Social Connections: Socially Integrated (01/31/2024)   Social Connection and Isolation Panel    Frequency of Communication with Friends and Family: More than three times a week    Frequency of Social Gatherings with Friends and Family: Once a week    Attends Religious Services: More than 4 times per year    Active Member of [employer] or Organizations: Yes    Attends Engineer, structural: More than 4 times per year    Marital Status: Married    Review of systems General: negative for malaise, night sweats, fever, chills, weight loss Neck: Negative for lumps, goiter, pain and significant neck swelling Resp: Negative for cough, wheezing, dyspnea at rest CV: Negative for chest pain, leg swelling, palpitations, orthopnea GI: denies melena, hematochezia, nausea, vomiting, diarrhea, constipation, dysphagia, odyonophagia, early satiety or unintentional weight loss.  MSK: Negative for joint pain or swelling, back pain, and muscle pain. Derm: Negative for itching or rash Psych: Denies  depression, anxiety, memory loss, confusion. No homicidal or suicidal ideation.  Heme: Negative for prolonged bleeding, bruising easily, and swollen nodes. Endocrine: Negative for cold or heat intolerance, polyuria, polydipsia and goiter. Neuro: negative for tremor, gait imbalance, syncope and seizures. The remainder of the review of systems is noncontributory.  Physical Exam: BP 130/83   Pulse 66   Temp (!) 97.3 F (36.3 C)   Ht 5' 2 (1.575 m)   Wt 170 lb 3.2 oz (77.2 kg)   BMI 31.13 kg/m  General:   Alert and oriented. No distress noted. Pleasant and cooperative.  Head:  Normocephalic and atraumatic. Eyes:  Conjuctiva clear without scleral icterus. Mouth:  Oral mucosa pink and moist. Good dentition. No lesions. Heart: Normal rate and rhythm, s1 and s2 heart sounds present.  Lungs: Clear lung sounds in  all lobes. Respirations equal and unlabored. Abdomen:  +BS, soft, non-tender and non-distended. No rebound or guarding. No HSM or masses noted. Derm: No palmar erythema or jaundice Neurologic:  Alert and  oriented x4 Psych:  Alert and cooperative. Normal mood and affect.  Invalid input(s): 6 MONTHS   ASSESSMENT: Paige Cole is a 73 y.o. female presenting today for follow up of GERD  No longer on PPI and denies any reflux symptoms, EGD 02/2023 was unremarkable. She denies any GI issues at this time. Will plan to see patient on as needed basis, should continue with good reflux precautions   PLAN:  -repeat colonoscopy 2028 -good reflux precautions   All questions were answered, patient verbalized understanding and is in agreement with plan as outlined above.   Follow Up: PRN  Xylon Croom L. Mariette, MSN, APRN, AGNP-C Adult-Gerontology Nurse Practitioner [facility]  I have reviewed the note and agree with the APP's assessment as described in this progress note  Toribio Fortune, MD Gastroenterology and Hepatology [facility]  Gastroenterology

## 2024-03-10 ENCOUNTER — Ambulatory Visit (HOSPITAL_COMMUNITY)

## 2024-03-10 ENCOUNTER — Encounter (HOSPITAL_COMMUNITY): Payer: Self-pay

## 2024-03-10 DIAGNOSIS — G4486 Cervicogenic headache: Secondary | ICD-10-CM | POA: Diagnosis not present

## 2024-03-10 DIAGNOSIS — M542 Cervicalgia: Secondary | ICD-10-CM

## 2024-03-10 NOTE — Therapy (Signed)
 OUTPATIENT PHYSICAL THERAPY CERVICAL TREATMENT  PHYSICAL THERAPY DISCHARGE SUMMARY  Visits from Start of Care: 7  Current functional level related to goals / functional outcomes: See below   Remaining deficits: See below   Education / Equipment: See below   Patient agrees to discharge. Patient goals were partially met. Patient is being discharged due to being pleased with the current functional level.  Patient Name: Paige Cole MRN: [id number] DOB:[birth year], 73 y.o., female Today's Date: 03/10/2024  END OF SESSION:  PT End of Session - 03/10/24 0731     Visit Number 8    Number of Visits 12    Date for Recertification  03/09/24    Authorization Type [employer]    Authorization Time Period no auth required    Progress Note Due on Visit 10    PT Start Time 0731    PT Stop Time 0800    PT Time Calculation (min) 29 min    Activity Tolerance Patient tolerated treatment well    Behavior During Therapy [facility] for tasks assessed/performed                Past Medical History:  Diagnosis Date   Arrhythmia 09/18/22   Stroke (HCC) 09/18/22   Past Surgical History:  Procedure Laterality Date   ABDOMINAL HYSTERECTOMY     COLONOSCOPY N/A 06/08/2018   Procedure: COLONOSCOPY;  Surgeon: Golda Claudis PENNER, MD;  [facility]: [facility];  Service: Endoscopy;  Laterality: N/A;  7:30   COLONOSCOPY WITH PROPOFOL  N/A 01/16/2022   Procedure: COLONOSCOPY WITH PROPOFOL ;  Surgeon: Eartha Angelia Sieving, MD;  Location: [facility];  Service: Gastroenterology;  Laterality: N/A;  730 ASA 2   ESOPHAGOGASTRODUODENOSCOPY (EGD) WITH PROPOFOL  N/A 03/26/2023   Procedure: ESOPHAGOGASTRODUODENOSCOPY (EGD) WITH PROPOFOL ;  Surgeon: Eartha Angelia Sieving, MD;  Location: [facility];  Service: Gastroenterology;  Laterality: N/A;  8:45AM;ASA 1   POLYPECTOMY  06/08/2018   Procedure: POLYPECTOMY;  Surgeon: Golda Claudis PENNER, MD;  Location: [facility];  Service: Endoscopy;;  cold  snare Buhler x 1, DC x1   POLYPECTOMY  01/16/2022   Procedure: POLYPECTOMY;  Surgeon: Eartha Angelia Sieving, MD;  Location: [facility];  Service: Gastroenterology;;   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   Obesity (BMI 30-39.9) 11/29/2021   History of diverticulitis 05/24/2019    PCP: Bluford Jacqulyn MATSU, DO   REFERRING PROVIDER: Georjean Darice HERO, MD  REFERRING DIAG: M54.2 (ICD-10-CM) - Neck pain G44.86 (ICD-10-CM) - Cervicogenic headache  THERAPY DIAG:  Cervicogenic headache  Cervicalgia  Rationale for Evaluation and Treatment: Rehabilitation  ONSET DATE: May of 24, told she had a stroke and shortly after that the headaches started, lesion in brain dissipated  SUBJECTIVE:  SUBJECTIVE STATEMENT: Pt states she had one headache, Pt states she did have one headache last Wednesday and had to take tylenol  to get it to ease off. Pt states she does not have any headache or neck pain this morning.   Eval: Pt states she was told she had a stroke in 2024 and started having neck pain and headaches after that, states the stroke lesion has since dissipated. Pt has kept a written record of the bad headaches, July 23, 25, 29, 31; Aug 5, 6, 7, 8, 15; Sept 6, 8. Pt has found that walking helps as well as just getting up and moving around.  Pt states that self mobilizations and STM of paraspinals has helped some. Pt  states no pain in the arms, denies any numbness or tingling in arms or hands. Pt states the headaches tend to start in the afternoon. Pt states last one was a small one yesterday afternoon.   Hand dominance: Right  PERTINENT HISTORY:  None reported  PAIN:  Are you having pain? No  PRECAUTIONS: None  RED FLAGS: None     WEIGHT BEARING RESTRICTIONS: No  FALLS:  Has patient fallen in last 6 months? No  OCCUPATION: Psychologist, forensic retired  PLOF: Independent and Independent with basic ADLs  PATIENT GOALS: To decreased intensity and frequency of headaches, to get better posture  NEXT MD VISIT: March   OBJECTIVE:  Note: Objective measures were completed at Evaluation unless otherwise noted.  DIAGNOSTIC FINDINGS:  CLINICAL DATA:  Neck pain for 1 year, no known injury, initial encounter   EXAM: CERVICAL SPINE - COMPLETE 4+ VIEW   COMPARISON:  None Available.   FINDINGS: Seven cervical segments are well visualized. Vertebral body height is well maintained. Multilevel osteophytic changes are seen. Neural foramina show mild narrowing bilaterally. The odontoid is within normal limits. No prevertebral soft tissue abnormality is seen.   IMPRESSION: Degenerative changes with mild bilateral neural foraminal narrowing.  PATIENT SURVEYS:  NDI: 2 / 50 = 4.0 %  COGNITION: Overall cognitive status: Within functional limits for tasks assessed  SENSATION: WFL  POSTURE: rounded shoulders and forward head  PALPATION: Tenderness to bilateral UT musculature   CERVICAL ROM:   Active ROM A/PROM (deg) eval AROM, 03/10/24  Flexion 35, crepitus reported 45  Extension 45, pull in left upper trap 50  Right lateral flexion 30 35  Left lateral flexion 30 35  Right rotation 70 72  Left rotation 64 70   (Blank rows = not tested)  UPPER EXTREMITY ROM:  Active ROM Right eval Left eval  Shoulder flexion    Shoulder extension    Shoulder abduction    Shoulder adduction     Shoulder extension    Shoulder internal rotation    Shoulder external rotation    Elbow flexion    Elbow extension    Wrist flexion    Wrist extension    Wrist ulnar deviation    Wrist radial deviation    Wrist pronation    Wrist supination     (Blank rows = not tested)  UPPER EXTREMITY MMT:  MMT Right eval Left eval  Shoulder flexion    Shoulder extension    Shoulder abduction    Shoulder adduction    Shoulder extension    Shoulder internal rotation    Shoulder external rotation    Middle trapezius 3+ 3+  Lower trapezius 3- 3-  Elbow flexion    Elbow extension    Wrist flexion  Wrist extension    Wrist ulnar deviation    Wrist radial deviation    Wrist pronation    Wrist supination    Grip strength     (Blank rows = not tested)  CERVICAL SPECIAL TESTS:  None performed this date    TREATMENT DATE:  03/10/2024  Discharge Note: Goals tracked, HEP reviewed and revised, Cervical ROM assessed, pt educated on referral process and importance of HEP compliance Therapeutic Exercise: -UBE, 3 minutes, 1.5 fwd, 1.5 bwd, pt cued for over 60 SPM, level three resistance -Kettle bell upright row, 2 sets of 8 reps, 5lb kettle bell, pt cued for upright posture and elbow flexion                                                                                                                           PATIENT EDUCATION:  Education details: Pt was educated on findings of PT evaluation, prognosis, frequency of therapy visits and rationale, attendance policy, and HEP if given.   Person educated: Patient Education method: Explanation, Verbal cues, and Handouts Education comprehension: verbalized understanding, verbal cues required, and needs further education  HOME EXERCISE PROGRAM: Access Code: [facility] URL: [email or web address] Date: 03/10/2024 Prepared by: Lang Ada  Exercises - Seated Scapular Retraction  - 1 x daily - 7 x weekly - 3 sets - 10 reps  - 3 hold - Seated Upper Trapezius Stretch  - 1 x daily - 7 x weekly - 1 sets - 3 reps - 20 hold - Seated Cervical Retraction  - 1 x daily - 7 x weekly - 3 sets - 10 reps - 3 hold - Seated Assisted Cervical Rotation with Towel  - 1 x daily - 7 x weekly - 1 sets - 3 reps - 10 sec hold - Goblet Squat with Kettlebell  - 1 x daily - 7 x weekly - 2 sets - 10 reps - Kettlebell Suitcase Carry  - 1 x daily - 7 x weekly - 3 sets - 10 reps - Kettlebell Squat  - 1 x daily - 7 x weekly - 2 sets - 10 reps - Kettlebell Swing  - 1 x daily - 7 x weekly - 3 sets - 10 reps - Standing Upright Shoulder Row with Barbell  - 1 x daily - 7 x weekly - 3 sets - 10 reps - Standing Bilateral Low Shoulder Row with Anchored Resistance  - 1 x daily - 7 x weekly - 3 sets - 10 reps - Shoulder extension with resistance - Neutral  - 1 x daily - 7 x weekly - 3 sets - 10 reps - Standing Shoulder Single Arm PNF D2 Flexion with Anchored Resistance  - 1 x daily - 7 x weekly - 3 sets - 10 reps  ASSESSMENT:  CLINICAL IMPRESSION: Patient continues to demonstrate increased cervical spine mobility and endurance, decreased frequency of headaches and increased activity tolerance. Patient also demonstrates ability to meet all goals except for one goal,  intensity of headache last week was 8/10. Patient able to recall even advanced HEP with good performance. Patient to be discharged from skilled therapy this date due to meeting majority of goals and patient being comfortable with advanced HEP for continued progress.       OBJECTIVE IMPAIRMENTS: decreased activity tolerance, decreased ROM, decreased strength, hypomobility, and pain.   ACTIVITY LIMITATIONS: lifting, dressing, and reach over head  PARTICIPATION LIMITATIONS: meal prep, cleaning, laundry, shopping, community activity, and yard work  PERSONAL FACTORS: Age, Past/current experiences, and Time since onset of injury/illness/exacerbation are also affecting patient's functional  outcome.   REHAB POTENTIAL: Good  CLINICAL DECISION MAKING: Stable/uncomplicated  EVALUATION COMPLEXITY: Low   GOALS: Goals reviewed with patient? No  SHORT TERM GOALS: Target date: 02/17/24  Pt will be independent with HEP in order to demonstrate participation in Physical Therapy POC.  Baseline: Goal status: MET  2.  Pt will report 5/10 headache pain at worst with cervical mobility in order to demonstrate improved pain with ADLs and decreased intensity of headaches.  Baseline:  Goal status: IN PROGRESS  LONG TERM GOALS: Target date: 03/09/24  Pt will report decreased cervicogenic caused headaches to less than 2 per week in order to demonstrate improved quality of life.  Baseline: see eval subjective for pt recordings.  Goal status: MET  2.  Pt will improve cervical ROM (flex/ext/lateral flexion/rotation) by 15 degrees combined in order to demonstrate improved functional ambulatory capacity in community setting.  Baseline: see objective.  Goal status: MET  3.  Pt will improve NDI score by at least 11.75 points in order to demonstrate decreased pain with functional goals and outcomes. Baseline: see objective.  Goal status: MET  4.  Pt will report 3/10 pain with cervical mobility in order to demonstrate reduced pain with ADLs that require use of cervical spine musculature (driving, washing hair, reaching to elevated cabinet).  Baseline: see objective.  Goal status: MET     PLAN:  PT FREQUENCY: 1-2x/week  PT DURATION: 6 weeks  PLANNED INTERVENTIONS: 97110-Therapeutic exercises, 97530- Therapeutic activity, 97112- Neuromuscular re-education, 97535- Self Care, [id number]- Manual therapy, Spinal mobilization, Cryotherapy, and Moist heat  PLAN FOR NEXT SESSION:  discharged  Lang Ada, PT, DPT [facility] Office: [phone] 8:12 AM, 03/10/24

## 2024-04-01 ENCOUNTER — Encounter: Payer: Self-pay | Admitting: Neurology

## 2024-07-20 ENCOUNTER — Ambulatory Visit: Admitting: Neurology

## 2025-02-11 ENCOUNTER — Ambulatory Visit
